# Patient Record
Sex: Female | Born: 1990 | Race: Black or African American | Hispanic: No | Marital: Single | Smoking: Current every day smoker
Health system: Southern US, Community
[De-identification: ages and names within clinical notes are randomized; demographics above are authoritative.]

---

## 2013-05-27 ENCOUNTER — Encounter (HOSPITAL_COMMUNITY): Payer: Self-pay | Admitting: Emergency Medicine

## 2013-05-27 ENCOUNTER — Emergency Department (HOSPITAL_COMMUNITY): Payer: Self-pay

## 2013-05-27 ENCOUNTER — Emergency Department (HOSPITAL_COMMUNITY)
Admission: EM | Admit: 2013-05-27 | Discharge: 2013-05-27 | Discharge status: Against Medical Advice | Payer: Self-pay | Attending: Emergency Medicine | Admitting: Emergency Medicine

## 2013-05-27 DIAGNOSIS — R102 Pelvic and perineal pain: Secondary | ICD-10-CM

## 2013-05-27 DIAGNOSIS — N949 Unspecified condition associated with female genital organs and menstrual cycle: Secondary | ICD-10-CM | POA: Insufficient documentation

## 2013-05-27 DIAGNOSIS — F17200 Nicotine dependence, unspecified, uncomplicated: Secondary | ICD-10-CM | POA: Insufficient documentation

## 2013-05-27 DIAGNOSIS — Z3202 Encounter for pregnancy test, result negative: Secondary | ICD-10-CM | POA: Insufficient documentation

## 2013-05-27 DIAGNOSIS — R109 Unspecified abdominal pain: Secondary | ICD-10-CM | POA: Insufficient documentation

## 2013-05-27 LAB — URINALYSIS, ROUTINE W REFLEX MICROSCOPIC
Bilirubin Urine: NEGATIVE
GLUCOSE, UA: NEGATIVE mg/dL
HGB URINE DIPSTICK: NEGATIVE
Ketones, ur: NEGATIVE mg/dL
Leukocytes, UA: NEGATIVE
Nitrite: NEGATIVE
Protein, ur: NEGATIVE mg/dL
SPECIFIC GRAVITY, URINE: 1.029 (ref 1.005–1.030)
Urobilinogen, UA: 1 mg/dL (ref 0.0–1.0)
pH: 5.5 (ref 5.0–8.0)

## 2013-05-27 LAB — HIV ANTIBODY (ROUTINE TESTING W REFLEX): HIV: NONREACTIVE

## 2013-05-27 LAB — CBC WITH DIFFERENTIAL/PLATELET
BASOS ABS: 0 10*3/uL (ref 0.0–0.1)
Basophils Relative: 0 % (ref 0–1)
EOS PCT: 3 % (ref 0–5)
Eosinophils Absolute: 0.2 10*3/uL (ref 0.0–0.7)
HCT: 43.2 % (ref 36.0–46.0)
Hemoglobin: 14.7 g/dL (ref 12.0–15.0)
LYMPHS PCT: 32 % (ref 12–46)
Lymphs Abs: 2.9 10*3/uL (ref 0.7–4.0)
MCH: 29.9 pg (ref 26.0–34.0)
MCHC: 34 g/dL (ref 30.0–36.0)
MCV: 87.8 fL (ref 78.0–100.0)
Monocytes Absolute: 0.8 10*3/uL (ref 0.1–1.0)
Monocytes Relative: 9 % (ref 3–12)
NEUTROS ABS: 5 10*3/uL (ref 1.7–7.7)
Neutrophils Relative %: 56 % (ref 43–77)
Platelets: 323 10*3/uL (ref 150–400)
RBC: 4.92 MIL/uL (ref 3.87–5.11)
RDW: 13 % (ref 11.5–15.5)
WBC: 9 10*3/uL (ref 4.0–10.5)

## 2013-05-27 LAB — WET PREP, GENITAL
Clue Cells Wet Prep HPF POC: NONE SEEN
Trich, Wet Prep: NONE SEEN
WBC, Wet Prep HPF POC: NONE SEEN
Yeast Wet Prep HPF POC: NONE SEEN

## 2013-05-27 LAB — I-STAT CREATININE, ED: CREATININE: 0.9 mg/dL (ref 0.50–1.10)

## 2013-05-27 LAB — PREGNANCY, URINE: PREG TEST UR: NEGATIVE

## 2013-05-27 MED ORDER — METRONIDAZOLE 500 MG PO TABS
500.0000 mg | ORAL_TABLET | Freq: Once | ORAL | Status: AC
  Administered 2013-05-27: 500 mg via ORAL
  Filled 2013-05-27: qty 1

## 2013-05-27 MED ORDER — CEFTRIAXONE SODIUM 250 MG IJ SOLR
250.0000 mg | Freq: Once | INTRAMUSCULAR | Status: AC
  Administered 2013-05-27: 250 mg via INTRAMUSCULAR
  Filled 2013-05-27: qty 250

## 2013-05-27 MED ORDER — HYDROMORPHONE HCL PF 1 MG/ML IJ SOLN
1.0000 mg | Freq: Once | INTRAMUSCULAR | Status: DC
  Filled 2013-05-27: qty 1

## 2013-05-27 MED ORDER — HYDROCODONE-ACETAMINOPHEN 5-325 MG PO TABS
2.0000 | ORAL_TABLET | Freq: Once | ORAL | Status: AC
  Administered 2013-05-27: 2 via ORAL
  Filled 2013-05-27: qty 2

## 2013-05-27 MED ORDER — METRONIDAZOLE 500 MG PO TABS
500.0000 mg | ORAL_TABLET | Freq: Two times a day (BID) | ORAL | Status: DC
Start: 2013-05-27 — End: 2013-10-23

## 2013-05-27 MED ORDER — OXYCODONE-ACETAMINOPHEN 5-325 MG PO TABS: 1.0000 | ORAL_TABLET | Freq: Four times a day (QID) | ORAL | Status: DC | PRN

## 2013-05-27 MED ORDER — DOXYCYCLINE HYCLATE 100 MG PO TABS
100.0000 mg | ORAL_TABLET | Freq: Once | ORAL | Status: AC
  Administered 2013-05-27: 100 mg via ORAL
  Filled 2013-05-27: qty 1

## 2013-05-27 MED ORDER — KETOROLAC TROMETHAMINE 30 MG/ML IJ SOLN
30.0000 mg | Freq: Once | INTRAMUSCULAR | Status: AC
  Administered 2013-05-27: 30 mg via INTRAMUSCULAR
  Filled 2013-05-27: qty 1

## 2013-05-27 MED ORDER — DOXYCYCLINE HYCLATE 100 MG PO CAPS: 100.0000 mg | ORAL_CAPSULE | Freq: Two times a day (BID) | ORAL | Status: DC

## 2013-05-27 MED ORDER — ONDANSETRON HCL 4 MG/2ML IJ SOLN
4.0000 mg | INTRAMUSCULAR | Status: DC
  Filled 2013-05-27: qty 2

## 2013-05-27 NOTE — ED Provider Notes (Signed)
I met with patient at 16:30.  Examined her abdomen, and reviewed her history.  Pain for over 48 hours.  Tenderness in right pelvis.  No direct RLQ/McBurney's TTP.  No peritoneal irritation.  Pain reproduced per patient with pelvic exam and with Pelvic US.  CT ordered, and labs requested.  Pt refuses CT scan after my re-evaluation.  Met with her again at 19:02.  Reviewed Morbidity associated with perforated/untreated appendicitis including chronic pain, need for additional procedures, SBOs, death, sepsis, prolonged hospitalization.  Ct tech standing in room ready to take patient directly to Ct.   Pt still refuses.  Will ask her to sign AMA.  Tanna Furry, MD 05/27/13 1911

## 2013-05-27 NOTE — ED Provider Notes (Signed)
Medical screening examination/treatment/procedure(s) were performed by non-physician practitioner and as supervising physician I was immediately available for consultation/collaboration.   EKG Interpretation None        Calil Amor, MD 05/27/13 2002 

## 2013-05-27 NOTE — ED Provider Notes (Signed)
CSN: [id number]     Arrival date & time 05/27/13  1327 History   First MD Initiated Contact with Patient 05/27/13 1401     Chief Complaint  Patient presents with  . Vaginal Pain  . Flank Pain     (Consider location/radiation/quality/duration/timing/severity/associated sxs/prior Treatment) HPI  Pt reports two days of sharp initially itnermittent then constant vaginal pain that radiates into suprapubic area and then into right flank.  Pain is 10/10 intensity, worse with coughing, sitting, certain positions.  Pt has Mirena in place that was placed in July 2014 in [place].  States she usually has 3 days of spotting at the beginning of every month but did not have spotting this month.  Denies fevers, N/V, change in bowel habits, urinary symptoms, abnormal vaginal discharge or bleeding.  No prior abdominal surgeries.    History reviewed. No pertinent past medical history. History reviewed. No pertinent past surgical history. No family history on file. History  Substance Use Topics  . Smoking status: Current Every Day Smoker -- 0.50 packs/day    Types: Cigarettes  . Smokeless tobacco: Not on file  . Alcohol Use: No   OB History   Grav Para Term Preterm Abortions TAB SAB Ect Mult Living                 Review of Systems  Constitutional: Negative for fever.  Respiratory: Negative for cough and shortness of breath.   Cardiovascular: Negative for chest pain.  Gastrointestinal: Negative for nausea, vomiting, abdominal pain and diarrhea.  Genitourinary: Negative for dysuria, urgency, frequency, vaginal bleeding and vaginal discharge.  All other systems reviewed and are negative.     Allergies  Review of patient's allergies indicates no known allergies.  Home Medications   Prior to Admission medications   Medication Sig Start Date End Date Taking? Authorizing Provider  levonorgestrel (MIRENA) 20 MCG/24HR IUD 1 each by Intrauterine route once.   Yes Historical Provider, MD   BP  124/91  Pulse 86  Temp(Src) 98.1 F (36.7 C) (Oral)  Resp 20  SpO2 98%  LMP 04/08/2013 Physical Exam  Nursing note and vitals reviewed. Constitutional: She appears well-developed and well-nourished. No distress.  HENT:  Head: Normocephalic and atraumatic.  Neck: Neck supple.  Pulmonary/Chest: Effort normal.  Abdominal: Soft. She exhibits no distension and no mass. There is no tenderness. There is no rebound and no guarding.  Genitourinary: Uterus is tender. Cervix exhibits motion tenderness. Cervix exhibits no discharge and no friability. Right adnexum displays no tenderness. Left adnexum displays no tenderness. There is tenderness around the vagina. No erythema or bleeding around the vagina. No foreign body around the vagina. No signs of injury around the vagina. No vaginal discharge found.  Exam limited secondary to patient body habitus. IUD string is not present coming through cervix.   Neurological: She is alert.  Skin: She is not diaphoretic.    ED Course  Procedures (including critical care time) Labs Review Labs Reviewed  URINALYSIS, ROUTINE W REFLEX MICROSCOPIC - Abnormal; Notable for the following:    APPearance CLOUDY (*)    All other components within normal limits  WET PREP, GENITAL  GC/CHLAMYDIA PROBE AMP  PREGNANCY, URINE  HIV ANTIBODY (ROUTINE TESTING)    Imaging Review No results found.   EKG Interpretation None      3:53 PM Discussed pt with Antony MaduraKelly Humes, PA-C, who assumes care of patient at change of shift.    MDM   Final diagnoses:  Pelvic pain  Pt with vaginal pain x 2 days, IUD not apparent on pelvic exam.  Pt pending pelvic US at change of shift.  Will need gyn referral.      Trixie Dredgemily Angus Amini, PA-C 05/27/13 1555

## 2013-05-27 NOTE — ED Provider Notes (Signed)
1500 - [facility] care [facility] at shift change. Patient presenting for vaginal pain x 2 days, IUD not apparent on pelvic exam. Pain is mostly R sided. U/S imaging pending. Plan to disposition appropriately based on imaging findings.  1740 - U/S shows abnormal appearing L ovary findings most c/w hemorrhagic cystic lesion; imaging findings unlikely to be ovarian abscess per radiology reading. F/u ultrasound recommended. IUD appears to be in proper position. No explanation for patient's R sided pain. Have evaluated patient who does have TTP in RLQ. Though atypical that appendicitis would cause CMT and pain originating in her "vagina" radiating to her RLQ, unable to fully exclude this as a diagnosis. Have consulted with my attending Dr. Fayrene FearingJames who will evaluate patient to decide how to proceed. At very least, believe patient warrants tx for PID given symptoms and abdominal tenderness.  1900 - CT pending. Ceftriaxone and first dose of doxy and flagyl ordered. Patient insistent on leaving. CT arrived to take patient for imaging which she declined. Patient stating that she "has kids at home" and cannot stay. It has been explained to the patient that like a CT scan means that appendicitis is unable to be excluded. Patient's been told that this may lead to worsening health outcomes to which she verbalizes understanding. Have initiated treatment for PID in the emergency department. Patient will be discharged AGAINST MEDICAL ADVICE with prescriptions for doxycycline and Flagyl as well as Percocet for pain control. Return precautions provided.   Results for orders placed during the hospital encounter of 05/27/13  WET PREP, GENITAL      Result Value Ref Range   Yeast Wet Prep HPF POC NONE SEEN  NONE SEEN   Trich, Wet Prep NONE SEEN  NONE SEEN   Clue Cells Wet Prep HPF POC NONE SEEN  NONE SEEN   WBC, Wet Prep HPF POC NONE SEEN  NONE SEEN  URINALYSIS, ROUTINE W REFLEX MICROSCOPIC      Result Value  Ref Range   Color, Urine YELLOW  YELLOW   APPearance CLOUDY (*) CLEAR   Specific Gravity, Urine 1.029  1.005 - 1.030   pH 5.5  5.0 - 8.0   Glucose, UA NEGATIVE  NEGATIVE mg/dL   Hgb urine dipstick NEGATIVE  NEGATIVE   Bilirubin Urine NEGATIVE  NEGATIVE   Ketones, ur NEGATIVE  NEGATIVE mg/dL   Protein, ur NEGATIVE  NEGATIVE mg/dL   Urobilinogen, UA 1.0  0.0 - 1.0 mg/dL   Nitrite NEGATIVE  NEGATIVE   Leukocytes, UA NEGATIVE  NEGATIVE  PREGNANCY, URINE      Result Value Ref Range   Preg Test, Ur NEGATIVE  NEGATIVE  HIV ANTIBODY (ROUTINE TESTING)      Result Value Ref Range   HIV 1&2 Ab, 4th Generation NONREACTIVE  NONREACTIVE  CBC WITH DIFFERENTIAL      Result Value Ref Range   WBC 9.0  4.0 - 10.5 K/uL   RBC 4.92  3.87 - 5.11 MIL/uL   Hemoglobin 14.7  12.0 - 15.0 g/dL   HCT 16.143.2  09.636.0 - 04.546.0 %   MCV 87.8  78.0 - 100.0 fL   MCH 29.9  26.0 - 34.0 pg   MCHC 34.0  30.0 - 36.0 g/dL   RDW 40.913.0  81.111.5 - 91.415.5 %   Platelets 323  150 - 400 K/uL   Neutrophils Relative % 56  43 - 77 %   Neutro Abs 5.0  1.7 - 7.7 K/uL   Lymphocytes Relative 32  12 - 46 %   Lymphs Abs 2.9  0.7 - 4.0 K/uL   Monocytes Relative 9  3 - 12 %   Monocytes Absolute 0.8  0.1 - 1.0 K/uL   Eosinophils Relative 3  0 - 5 %   Eosinophils Absolute 0.2  0.0 - 0.7 K/uL   Basophils Relative 0  0 - 1 %   Basophils Absolute 0.0  0.0 - 0.1 K/uL  I-STAT CREATININE, ED      Result Value Ref Range   Creatinine, Ser 0.90  0.50 - 1.10 mg/dL   [place] Transvaginal Non-ob  05/27/2013   CLINICAL DATA:  Flank pain and vaginal pain.  EXAM: TRANSABDOMINAL AND TRANSVAGINAL ULTRASOUND OF PELVIS  DOPPLER ULTRASOUND OF OVARIES  TECHNIQUE: Both transabdominal and transvaginal ultrasound examinations of the pelvis were performed. Transabdominal technique was performed for global imaging of the pelvis including uterus, ovaries, adnexal regions, and pelvic cul-de-sac.  It was necessary to proceed with endovaginal exam following the transabdominal  exam to visualize the uterus and ovaries. Color and duplex Doppler ultrasound was utilized to evaluate blood flow to the ovaries.  COMPARISON:  None.  FINDINGS: Uterus  Measurements: 7.2 x 3.3 x 4.8 cm. No fibroids or other mass visualized.  Endometrium  Thickness: 4 mm. Shadowing caused by IUD appears appropriately position.  Right ovary  Measurements: 3.1 x 1.8 x 2.6 cm. Normal appearance/no adnexal mass.  Left ovary  Measurements: 5.5 x 2.4 x 2.8 cm, including a 2.2 x 1.8 x 1.7 cm complex cystic lesion with internal reticular septations and some blood flow within its margin as on image 43 of the first series. Adjacent 3.1 x 2.0 cm mass like portion of this lesion is echogenic but without demonstrated significant internal blood flow. Normal appearance/no adnexal mass.  Pulsed Doppler evaluation of both ovaries demonstrates normal low-resistance arterial and venous waveforms.  Other findings  Small amount of free pelvic fluid, appears complex.  IMPRESSION: 1. Abnormal appearance, left ovary, with a hemorrhagic cystic lesion and adjacent echogenic structure in the left ovary, without definite internal blood flow the lesion but with expected parenchymal flow in the left ovary. Hemorrhagic ovarian cyst is favored. There is a small amount of complex free pelvic fluid, likely representing blood products. Today's urine pregnancy test was negative and so ectopic pregnancy is not suspected. Ovarian abscess is unlikely. Assuming that the patient is stable and management is conservative, I do recommend a followup pelvic ultrasound in 6 weeks time in order to ensure resolution of this lesion and exclude the unlikely possibility of neoplasm. 2. No findings of ovarian torsion.   Electronically Signed   By: Herbie BaltimoreWalt  Liebkemann M.D.   On: 05/27/2013 17:25   [place] Pelvis Complete  05/27/2013   CLINICAL DATA:  Flank pain and vaginal pain.  EXAM: TRANSABDOMINAL AND TRANSVAGINAL ULTRASOUND OF PELVIS  DOPPLER ULTRASOUND OF OVARIES   TECHNIQUE: Both transabdominal and transvaginal ultrasound examinations of the pelvis were performed. Transabdominal technique was performed for global imaging of the pelvis including uterus, ovaries, adnexal regions, and pelvic cul-de-sac.  It was necessary to proceed with endovaginal exam following the transabdominal exam to visualize the uterus and ovaries. Color and duplex Doppler ultrasound was utilized to evaluate blood flow to the ovaries.  COMPARISON:  None.  FINDINGS: Uterus  Measurements: 7.2 x 3.3 x 4.8 cm. No fibroids or other mass visualized.  Endometrium  Thickness: 4 mm. Shadowing caused by IUD appears appropriately position.  Right ovary  Measurements: 3.1 x 1.8 x  2.6 cm. Normal appearance/no adnexal mass.  Left ovary  Measurements: 5.5 x 2.4 x 2.8 cm, including a 2.2 x 1.8 x 1.7 cm complex cystic lesion with internal reticular septations and some blood flow within its margin as on image 43 of the first series. Adjacent 3.1 x 2.0 cm mass like portion of this lesion is echogenic but without demonstrated significant internal blood flow. Normal appearance/no adnexal mass.  Pulsed Doppler evaluation of both ovaries demonstrates normal low-resistance arterial and venous waveforms.  Other findings  Small amount of free pelvic fluid, appears complex.  IMPRESSION: 1. Abnormal appearance, left ovary, with a hemorrhagic cystic lesion and adjacent echogenic structure in the left ovary, without definite internal blood flow the lesion but with expected parenchymal flow in the left ovary. Hemorrhagic ovarian cyst is favored. There is a small amount of complex free pelvic fluid, likely representing blood products. Today's urine pregnancy test was negative and so ectopic pregnancy is not suspected. Ovarian abscess is unlikely. Assuming that the patient is stable and management is conservative, I do recommend a followup pelvic ultrasound in 6 weeks time in order to ensure resolution of this lesion and exclude the  unlikely possibility of neoplasm. 2. No findings of ovarian torsion.   Electronically Signed   By: Herbie Baltimore M.D.   On: 05/27/2013 17:25   [place] Art/ven Flow Abd Pelv Doppler  05/27/2013   CLINICAL DATA:  Flank pain and vaginal pain.  EXAM: TRANSABDOMINAL AND TRANSVAGINAL ULTRASOUND OF PELVIS  DOPPLER ULTRASOUND OF OVARIES  TECHNIQUE: Both transabdominal and transvaginal ultrasound examinations of the pelvis were performed. Transabdominal technique was performed for global imaging of the pelvis including uterus, ovaries, adnexal regions, and pelvic cul-de-sac.  It was necessary to proceed with endovaginal exam following the transabdominal exam to visualize the uterus and ovaries. Color and duplex Doppler ultrasound was utilized to evaluate blood flow to the ovaries.  COMPARISON:  None.  FINDINGS: Uterus  Measurements: 7.2 x 3.3 x 4.8 cm. No fibroids or other mass visualized.  Endometrium  Thickness: 4 mm. Shadowing caused by IUD appears appropriately position.  Right ovary  Measurements: 3.1 x 1.8 x 2.6 cm. Normal appearance/no adnexal mass.  Left ovary  Measurements: 5.5 x 2.4 x 2.8 cm, including a 2.2 x 1.8 x 1.7 cm complex cystic lesion with internal reticular septations and some blood flow within its margin as on image 43 of the first series. Adjacent 3.1 x 2.0 cm mass like portion of this lesion is echogenic but without demonstrated significant internal blood flow. Normal appearance/no adnexal mass.  Pulsed Doppler evaluation of both ovaries demonstrates normal low-resistance arterial and venous waveforms.  Other findings  Small amount of free pelvic fluid, appears complex.  IMPRESSION: 1. Abnormal appearance, left ovary, with a hemorrhagic cystic lesion and adjacent echogenic structure in the left ovary, without definite internal blood flow the lesion but with expected parenchymal flow in the left ovary. Hemorrhagic ovarian cyst is favored. There is a small amount of complex free pelvic fluid, likely  representing blood products. Today's urine pregnancy test was negative and so ectopic pregnancy is not suspected. Ovarian abscess is unlikely. Assuming that the patient is stable and management is conservative, I do recommend a followup pelvic ultrasound in 6 weeks time in order to ensure resolution of this lesion and exclude the unlikely possibility of neoplasm. 2. No findings of ovarian torsion.   Electronically Signed   By: Herbie Baltimore M.D.   On: 05/27/2013 17:25  Antony Madura, PA-C 05/27/13 1920

## 2013-05-27 NOTE — ED Notes (Signed)
Pt from home c/o vaginal pain that radiates to R flank. Pt denies dysuria, frequency, vaginal d/c, N/V/D/F. Pt states that most of the pain is in her vagina, and wonders if "my Maxie Bettermerina has been knocked out of place." Pt adds that she is having back pain when she breathes, but has no cough. Pt is A&O and in NAD

## 2013-05-27 NOTE — Discharge Instructions (Signed)
We have initiated treatment for pelvic inflammatory disease in the emergency department. Began taking doxycycline and metronidazole at 7 AM tomorrow. Recommend ibuprofen 600 mg every 6 hours as needed for pain control. He may take Percocet as needed for breakthrough pain. Do not drink alcohol while taking metronidazole as it will make you violently ill vomiting. An OB/GYN for followup. Return if symptoms worsen.  Pelvic Inflammatory Disease Pelvic inflammatory disease (PID) refers to an infection in some or all of the female organs. The infection can be in the uterus, ovaries, fallopian tubes, or the surrounding tissues in the pelvis. PID can cause abdominal or pelvic pain that comes on suddenly (acute pelvic pain). PID is a serious infection because it can lead to lasting (chronic) pelvic pain or the inability to have children (infertile).  CAUSES  The infection is often caused by the normal bacteria found in the vaginal tissues. PID may also be caused by an infection that is spread during sexual contact. PID can also occur following:   The birth of a baby.   A miscarriage.   An abortion.   Major pelvic surgery.   The use of an intrauterine device (IUD).   A sexual assault.  RISK FACTORS Certain factors can put a person at higher risk for PID, such as:  Being younger than 25 years.  Being sexually active at [place] age.  Usingnonbarrier contraception.  Havingmultiple sexual partners.  Having sex with someone who has symptoms of a genital infection.  Using oral contraception. Other times, certain behaviors can increase the possibility of getting PID, such as:  Having sex during your period.  Using a vaginal douche.  Having an intrauterine device (IUD) in place. SYMPTOMS   Abdominal or pelvic pain.   Fever.   Chills.   Abnormal vaginal discharge.  Abnormal uterine bleeding.   Unusual pain shortly after finishing your period. DIAGNOSIS  Your caregiver  will choose some of the following methods to make a diagnosis, such as:   Performinga physical exam and history. A pelvic exam typically reveals a very tender uterus and surrounding pelvis.   Ordering laboratory tests including a pregnancy test, blood tests, and urine test.  Orderingcultures of the vagina and cervix to check for a sexually transmitted infection (STI).  Performing an ultrasound.   Performing a laparoscopic procedure to look inside the pelvis.  TREATMENT   Antibiotic medicines may be prescribed and taken by mouth.   Sexual partners may be treated when the infection is caused by a sexually transmitted disease (STD).   Hospitalization may be needed to give antibiotics intravenously.  Surgery may be needed, but this is rare. It may take weeks until you are completely well. If you are diagnosed with PID, you should also be checked for human immunodeficiency virus (HIV). HOME CARE INSTRUCTIONS   If given, take your antibiotics as directed. Finish the medicine even if you start to feel better.   Only take over-the-counter or prescription medicines for pain, discomfort, or fever as directed by your caregiver.   Do not have sexual intercourse until treatment is completed or as directed by your caregiver. If PID is confirmed, your recent sexual partner(s) will need treatment.   Keep your follow-up appointments. SEEK MEDICAL CARE IF:   You have increased or abnormal vaginal discharge.   You need prescription medicine for your pain.   You vomit.   You cannot take your medicines.   Your partner has an STD.  SEEK IMMEDIATE MEDICAL CARE IF:  You have a fever.   You have increased abdominal or pelvic pain.   You have chills.   You have pain when you urinate.   You are not better after 72 hours following treatment.  MAKE SURE YOU:   Understand these instructions.  Will watch your condition.  Will get help right away if you are not doing  well or get worse. Document Released: 01/25/2005 Document Revised: 05/22/2012 Document Reviewed: 01/21/2011 [facility] Patient Information 2014 [employer] [place].

## 2013-05-27 NOTE — ED Provider Notes (Signed)
Medical screening examination/treatment/procedure(s) were performed by non-physician practitioner and as supervising physician I was immediately available for consultation/collaboration.   EKG Interpretation None       Doug SouSam Female Iafrate, MD 05/27/13 1600

## 2013-05-27 NOTE — ED Notes (Signed)
Pt refusing to stay to get CT even though CT is in room to take pt for scan. Pt sates that she would be here another hour waiting on the results, Dr. Fayrene FearingJames in room explaining to her that she can leave but she is leaving AMA.

## 2013-05-27 NOTE — ED Notes (Signed)
PA at bedside.

## 2013-05-28 LAB — GC/CHLAMYDIA PROBE AMP
CT Probe RNA: NEGATIVE
GC Probe RNA: NEGATIVE

## 2013-10-23 ENCOUNTER — Encounter (HOSPITAL_COMMUNITY): Payer: Self-pay | Admitting: Emergency Medicine

## 2013-10-23 ENCOUNTER — Emergency Department (HOSPITAL_COMMUNITY)
Admission: EM | Admit: 2013-10-23 | Discharge: 2013-10-23 | Disposition: A | Discharge status: Home / Self Care | Payer: Self-pay | Attending: Emergency Medicine | Admitting: Emergency Medicine

## 2013-10-23 DIAGNOSIS — F17200 Nicotine dependence, unspecified, uncomplicated: Secondary | ICD-10-CM | POA: Insufficient documentation

## 2013-10-23 DIAGNOSIS — K0889 Other specified disorders of teeth and supporting structures: Secondary | ICD-10-CM

## 2013-10-23 DIAGNOSIS — K089 Disorder of teeth and supporting structures, unspecified: Secondary | ICD-10-CM | POA: Insufficient documentation

## 2013-10-23 DIAGNOSIS — K029 Dental caries, unspecified: Secondary | ICD-10-CM | POA: Insufficient documentation

## 2013-10-23 MED ORDER — OXYCODONE-ACETAMINOPHEN 5-325 MG PO TABS
1.0000 | ORAL_TABLET | Freq: Once | ORAL | Status: AC
  Administered 2013-10-23: 1 via ORAL
  Filled 2013-10-23: qty 1

## 2013-10-23 MED ORDER — HYDROCODONE-ACETAMINOPHEN 5-325 MG PO TABS: ORAL_TABLET | ORAL | Status: DC

## 2013-10-23 MED ORDER — NAPROXEN 500 MG PO TABS: 500.0000 mg | ORAL_TABLET | Freq: Two times a day (BID) | ORAL | Status: DC

## 2013-10-23 MED ORDER — PENICILLIN V POTASSIUM 500 MG PO TABS: 500.0000 mg | ORAL_TABLET | Freq: Three times a day (TID) | ORAL | Status: DC

## 2013-10-23 NOTE — ED Provider Notes (Signed)
CSN: [id number]     Arrival date & time 10/23/13  1856 History  This chart was scribed for non-physician practitioner, Rhea Bleacher, PA-C working with Glynn Octave, MD by Greggory Stallion, ED scribe. This patient was seen in room TR05C/TR05C and the patient's care was started at 7:53 PM.   Chief Complaint  Patient presents with  . Dental Pain   The history is provided by the patient. No language interpreter was used.   HPI Comments: Amber Bray is a 23 y.o. female who presents to the Emergency Department complaining of worsening sharp left upper dental pain that started 4 days ago. Reports associated facial swelling. Denies any drainage from the tooth. Pt has taken Advil PM and tried ice with some relief of swelling. Pressure worsens the pain.   History reviewed. No pertinent past medical history. History reviewed. No pertinent past surgical history. No family history on file. History  Substance Use Topics  . Smoking status: Current Every Day Smoker -- 0.50 packs/day    Types: Cigarettes  . Smokeless tobacco: Not on file  . Alcohol Use: No   OB History   Grav Para Term Preterm Abortions TAB SAB Ect Mult Living                 Review of Systems  Constitutional: Negative for fever.  HENT: Positive for dental problem and facial swelling.   Eyes: Negative for redness.  Respiratory: Negative for shortness of breath.   Cardiovascular: Negative for chest pain.  Gastrointestinal: Negative for abdominal distention.  Musculoskeletal: Negative for gait problem.  Skin: Negative for rash.  Neurological: Negative for speech difficulty.  Psychiatric/Behavioral: Negative for confusion.   Allergies  Review of patient's allergies indicates no known allergies.  Home Medications   Prior to Admission medications   Medication Sig Start Date End Date Taking? Authorizing Provider  levonorgestrel (MIRENA) 20 MCG/24HR IUD 1 each by Intrauterine route once.    Historical Provider, MD   BP  128/101  Pulse 88  Temp(Src) 98.3 F (36.8 C) (Oral)  Resp 20  SpO2 99%  LMP 10/22/2013  Physical Exam  Nursing note and vitals reviewed. Constitutional: She appears well-developed and well-nourished.  HENT:  Head: Normocephalic and atraumatic.  Right Ear: Tympanic membrane, external ear and ear canal normal.  Left Ear: Tympanic membrane, external ear and ear canal normal.  Nose: Nose normal.  Mouth/Throat: Uvula is midline, oropharynx is clear and moist and mucous membranes are normal. No trismus in the jaw. Abnormal dentition. Dental caries present. No dental abscesses or uvula swelling. No tonsillar abscesses.  Patient with no significant tenderness on exam. States pain is at base of L maxillary 2nd molar. No swelling or erythema noted on exam.  Eyes: Conjunctivae are normal.  Neck: Normal range of motion. Neck supple.  No neck swelling or Ludwig's angina  Lymphadenopathy:    She has no cervical adenopathy.  Neurological: She is alert.  Skin: Skin is warm and dry.  Psychiatric: She has a normal mood and affect.    ED Course  Procedures (including critical care time)  DIAGNOSTIC STUDIES: Oxygen Saturation is 99% on RA, normal by my interpretation.    COORDINATION OF CARE: 7:55 PM-Discussed treatment plan which includes narcotic pain medication and continuing ibuprofen with pt at bedside and pt agreed to plan. Pt will be discharged with penicillin prescription but advised her not to fill it unless the facial swelling begins again and symptoms do not start resolving. Will give pt dental referrals and  advised her to follow up.   Labs Review Labs Reviewed - No data to display  Imaging Review No results found.   EKG Interpretation None      Patient seen and examined.    Vital signs reviewed and are as follows: BP 124/81  Pulse 59  Temp(Src) 98.3 F (36.8 C) (Oral)  Resp 20  SpO2 100%  LMP 10/22/2013  Patient counseled on use of narcotic pain medications.  Counseled not to combine these medications with others containing tylenol. Urged not to drink alcohol, drive, or perform any other activities that requires focus while taking these medications. The patient verbalizes understanding and agrees with the plan.  Patient counseled to take prescribed medications as directed, return with worsening facial or neck swelling, and to follow-up with their dentist as soon as possible.    MDM   Final diagnoses:  Pain, dental   Patient with toothache. No fever. Exam unconcerning for Ludwig's angina or other deep tissue infection in neck.   As there is no facial swelling or gum findings, will not prescribe antibiotics at this time. But patient will be issued rx to take if no improvement in 72 hrs or if she develops facial swelling. Will treat with pain medication.     I personally performed the services described in this documentation, which was scribed in my presence. The recorded information has been reviewed and is accurate.  Renne Crigler, PA-C 10/23/13 2051

## 2013-10-23 NOTE — ED Notes (Signed)
C/o L upper tooth pain, onset friday night. Also swelling. (denies: drainage, nv, fever or there sx), has tried ice and ibuprofen. No advil today.

## 2013-10-23 NOTE — Discharge Instructions (Signed)
Please read and follow all provided instructions.  Your diagnoses today include:  1. Pain, dental    The exam and treatment you received today has been provided on an emergency basis only. This is not a substitute for complete medical or dental care.  Tests performed today include:  Vital signs. See below for your results today.   Medications prescribed:   Vicodin (hydrocodone/acetaminophen) - narcotic pain medication  DO NOT drive or perform any activities that require you to be awake and alert because this medicine can make you drowsy. BE VERY CAREFUL not to take multiple medicines containing Tylenol (also called acetaminophen). Doing so can lead to an overdose which can damage your liver and cause liver failure and possibly death.   Naproxen - anti-inflammatory pain medication  Do not exceed  naproxen every 12 hours, take with food  You have been prescribed an anti-inflammatory medication or NSAID. Take with food. Take smallest effective dose for the shortest duration needed for your pain. Stop taking if you experience stomach pain or vomiting.    Penicillin - antibiotic  You have been prescribed an antibiotic medicine: take the entire course of medicine even if you are feeling better. Stopping early can cause the antibiotic not to work.  Only fill this antibiotic if you worsen or continue to have symptoms in 72 hours. If filled, take entire course of antibiotics as directed and follow-up with your doctor.  Take any prescribed medications only as directed.  Home care instructions:  Follow any educational materials contained in this packet.  Follow-up instructions: Please follow-up with your dentist for further evaluation of your symptoms.   Dental Assistance: See below for dental referrals  Return instructions:   Please return to the Emergency Department if you experience worsening symptoms.  Please return if you develop a fever, you develop more swelling in your  face or neck, you have trouble breathing or swallowing food.  Please return if you have any other emergent concerns.  Additional Information:  Your vital signs today were: BP 128/101   Pulse 88   Temp(Src) 98.3 F (36.8 C) (Oral)   Resp 20   SpO2 99%   LMP 10/22/2013 If your blood pressure (BP) was elevated above 135/85 this visit, please have this repeated by your doctor within one month. -------------- Dental Care: Organization         Address  Phone  Notes  [facility] Department of [facility] [address] [place], [place] [phone] Accepts children up to age 51 who are enrolled in [place] or [place] Health Choice; pregnant women with a Medicaid card; and children who have applied for Medicaid or [place] Health Choice, but were declined, whose parents can pay a reduced fee at time of service.  [facility] Department of [facility]  [address], [place] [phone] Accepts children up to age 85 who are enrolled in [place] or [place] Health Choice; pregnant women with a Medicaid card; and children who have applied for Medicaid or [place] Health Choice, but were declined, whose parents can pay a reduced fee at time of service.  [facility]  [address] [place], [place] [phone] Patients are seen by appointment only. Walk-ins are not accepted. [facility] will see patients 35 years of age and older. Monday - Tuesday (8am-5pm) Most Wednesdays (8:30-5pm) $30 per visit, cash only  [facility] [employer] [facility]  [address], [place] [phone])  [phone] Patients are seen by appointment only. Walk-ins are not accepted. [facility] will see patients 65 years of age and older. One Wednesday Evening (Monthly: Volunteer Based).  $30 per visit, cash only  [employer] [facility] [employer]  [phone] for adults; Children under age 75, call Graduate Pediatric Dentistry at [phone]. Children  aged 50-14, please call [phone] to request a pediatric application.  Dental services are provided in all areas of dental care including fillings, crowns and bridges, complete and partial dentures, implants, gum treatment, root canals, and extractions. Preventive care is also provided. Treatment is provided to both adults and children. Patients are selected via a lottery and there is often a waiting list.   [facility] [address], [place]  [phone] [email or web address]   [facility] [address] [place], [place] [phone], Ext. [phone] Second and Fourth Thursday of each month, opens at 6:30 AM; Clinic ends at 9 AM.  Patients are seen on a first-come first-served basis, and a limited number are seen during each clinic.   [facility]  [address] [place], [place] [phone]   Eligibility Requirements You must have lived in [place], [place], or [place] counties for at least the last three months.   You cannot be eligible for state or federal sponsored [place], including [employer], [place], or [employer].   You generally cannot be eligible for healthcare insurance through your employer.    How to apply: Eligibility screenings are held every Tuesday and Wednesday afternoon from 1:00 pm until 4:00 pm. You do not need an appointment for the interview!  [facility] [address] [place], [place] [phone]   [facility]  [phone]   [facility]  [phone]   [facility]  [phone]

## 2013-10-23 NOTE — ED Provider Notes (Signed)
Medical screening examination/treatment/procedure(s) were performed by non-physician practitioner and as supervising physician I was immediately available for consultation/collaboration.   EKG Interpretation None        Demeisha Geraghty, MD 10/23/13 2322 

## 2014-02-17 ENCOUNTER — Encounter (HOSPITAL_COMMUNITY): Payer: Self-pay | Admitting: Emergency Medicine

## 2014-02-17 ENCOUNTER — Emergency Department (HOSPITAL_COMMUNITY)
Admission: EM | Admit: 2014-02-17 | Discharge: 2014-02-17 | Disposition: A | Discharge status: Home / Self Care | Payer: Self-pay | Attending: Emergency Medicine | Admitting: Emergency Medicine

## 2014-02-17 DIAGNOSIS — F419 Anxiety disorder, unspecified: Secondary | ICD-10-CM | POA: Insufficient documentation

## 2014-02-17 DIAGNOSIS — Z791 Long term (current) use of non-steroidal anti-inflammatories (NSAID): Secondary | ICD-10-CM | POA: Insufficient documentation

## 2014-02-17 DIAGNOSIS — Z720 Tobacco use: Secondary | ICD-10-CM | POA: Insufficient documentation

## 2014-02-17 DIAGNOSIS — Z792 Long term (current) use of antibiotics: Secondary | ICD-10-CM | POA: Insufficient documentation

## 2014-02-17 LAB — CBC
HCT: 43.7 % (ref 36.0–46.0)
HEMOGLOBIN: 14.5 g/dL (ref 12.0–15.0)
MCH: 29.3 pg (ref 26.0–34.0)
MCHC: 33.2 g/dL (ref 30.0–36.0)
MCV: 88.3 fL (ref 78.0–100.0)
Platelets: 328 10*3/uL (ref 150–400)
RBC: 4.95 MIL/uL (ref 3.87–5.11)
RDW: 13.3 % (ref 11.5–15.5)
WBC: 6.7 10*3/uL (ref 4.0–10.5)

## 2014-02-17 LAB — COMPREHENSIVE METABOLIC PANEL
ALT: 15 U/L (ref 0–35)
AST: 18 U/L (ref 0–37)
Albumin: 3.9 g/dL (ref 3.5–5.2)
Alkaline Phosphatase: 77 U/L (ref 39–117)
Anion gap: 8 (ref 5–15)
BUN: 10 mg/dL (ref 6–23)
CALCIUM: 9.4 mg/dL (ref 8.4–10.5)
CO2: 25 mmol/L (ref 19–32)
Chloride: 105 mEq/L (ref 96–112)
Creatinine, Ser: 0.92 mg/dL (ref 0.50–1.10)
GFR, EST NON AFRICAN AMERICAN: 86 mL/min — AB (ref >90)
GLUCOSE: 91 mg/dL (ref 70–99)
Potassium: 4.4 mmol/L (ref 3.5–5.1)
SODIUM: 138 mmol/L (ref 135–145)
Total Bilirubin: 0.2 mg/dL — ABNORMAL LOW (ref 0.3–1.2)
Total Protein: 7.4 g/dL (ref 6.0–8.3)

## 2014-02-17 MED ORDER — LORAZEPAM 1 MG PO TABS
1.0000 mg | ORAL_TABLET | Freq: Once | ORAL | Status: AC
  Administered 2014-02-17: 1 mg via ORAL
  Filled 2014-02-17: qty 1

## 2014-02-17 NOTE — Discharge Instructions (Signed)
It was our pleasure to provide your ER care today - we hope that you feel better.  Follow up with primary care doctor in coming week.  Return to ER if worse, new symptoms, other concern.  You were given medication in the ER that can cause drowsiness - no driving for the next 4 hours.     Generalized Anxiety Disorder Generalized anxiety disorder (GAD) is a mental disorder. It interferes with life functions, including relationships, work, and school. GAD is different from normal anxiety, which everyone experiences at some point in their lives in response to specific life events and activities. Normal anxiety actually helps us prepare for and get through these life events and activities. Normal anxiety goes away after the event or activity is over.  GAD causes anxiety that is not necessarily related to specific events or activities. It also causes excess anxiety in proportion to specific events or activities. The anxiety associated with GAD is also difficult to control. GAD can vary from mild to severe. People with severe GAD can have intense waves of anxiety with physical symptoms (panic attacks).  SYMPTOMS The anxiety and worry associated with GAD are difficult to control. This anxiety and worry are related to many life events and activities and also occur more days than not for 6 months or longer. People with GAD also have three or more of the following symptoms (one or more in children):  Restlessness.   Fatigue.  Difficulty concentrating.   Irritability.  Muscle tension.  Difficulty sleeping or unsatisfying sleep. DIAGNOSIS GAD is diagnosed through an assessment by your health care provider. Your health care provider will ask you questions aboutyour mood,physical symptoms, and events in your life. Your health care provider may ask you about your medical history and use of alcohol or drugs, including prescription medicines. Your health care provider may also do a physical exam and  blood tests. Certain medical conditions and the use of certain substances can cause symptoms similar to those associated with GAD. Your health care provider may refer you to a mental health specialist for further evaluation. TREATMENT The following therapies are usually used to treat GAD:   Medication. Antidepressant medication usually is prescribed for long-term daily control. Antianxiety medicines may be added in severe cases, especially when panic attacks occur.   Talk therapy (psychotherapy). Certain types of talk therapy can be helpful in treating GAD by providing support, education, and guidance. A form of talk therapy called cognitive behavioral therapy can teach you healthy ways to think about and react to daily life events and activities.  Stress managementtechniques. These include yoga, meditation, and exercise and can be very helpful when they are practiced regularly. A mental health specialist can help determine which treatment is best for you. Some people see improvement with one therapy. However, other people require a combination of therapies. Document Released: 05/22/2012 Document Revised: 06/11/2013 Document Reviewed: 05/22/2012 [facility] Patient Information 2015 [place]. This information is not intended to replace advice given to you by your health care provider. Make sure you discuss any questions you have with your health care provider.     Hyperventilation Hyperventilation is breathing that is deeper and more rapid than normal. It is usually associated with panic and anxiety. Hyperventilation can make you feel breathless. It is sometimes called overbreathing. Breathing out too much causes a decrease in the amount of carbon dioxide gas in the blood. This leads to tingling and numbness in the hands, feet, and around the mouth. If this continues,  your fingers, hands, and toes may begin to spasm. Hyperventilation usually lasts 20-30 minutes and can be associated with other  symptoms of panic and anxiety, including:   Chest pains or tightness.  A pounding or irregular, racing heartbeat (palpitations).  Dizziness.  Lightheadedness.  Dry mouth.  Weakness.  Confusion.  Sleep disturbance. CAUSES  Sudden onset (acute) hyperventilation is usually triggered by acute stress, anxiety, or emotional upset. Long-term (chronic) and recurring hyperventilation can occur with chronic lung problems, such emphysema or asthma. Other causes include:   Nervousness.  Stress.  Stimulant, drug, or alcohol use.  Lung disease.  Infections, such as pneumonia.  Heart problems.  Severe pain.  Waking from a bad dream.  Pregnancy.  Bleeding. HOME CARE INSTRUCTIONS  Learn and use breathing exercises that help you breathe from your diaphragm and abdomen.  Practice relaxation techniques to reduce stress, such as visualization, meditation, and muscle release.  During an attack, try breathing into a paper bag. This changes the carbon dioxide level and slows down breathing. SEEK IMMEDIATE MEDICAL CARE IF:  Your hyperventilation continues or gets worse. MAKE SURE YOU:  Understand these instructions.  Will watch your condition.  Will get help right away if you are not doing well or get worse. Document Released: 01/23/2000 Document Revised: 07/27/2011 Document Reviewed: 05/06/2011 [facility] Patient Information 2015 [place], [place]. This information is not intended to replace advice given to you by your health care provider. Make sure you discuss any questions you have with your health care provider.

## 2014-02-17 NOTE — ED Notes (Signed)
Pt. Stated, Ive had mouth numb, my teeth feel numb.  Im also having the shakes and having chills and then it gores away.  I take medicine cause [facility]  It might be the medications I take.

## 2014-02-17 NOTE — ED Provider Notes (Signed)
CSN: [id number]     Arrival date & time 02/17/14  1046 History   First MD Initiated Contact with Patient 02/17/14 1250     Chief Complaint  Patient presents with  . Shaking  . Chills    "mouth and teeth feel numb"     (Consider location/radiation/quality/duration/timing/severity/associated sxs/prior Treatment) The history is provided by the patient.  pt indicates hx anxiety, states was recently treated at [facility] for same. States last week her celexa was changed to paxil, but that she hasnt yet filled the paxil rx. States she is feeling anxious. States earlier today had 'hot flash', and then felt numb around mouth and teeth, and felt as if she was breathing fast. No palpitations. Denies cp or sob. No cough or uri c/o. No fever or chills. Has been eating and drinking normally, normal appetite. Denies depression.  Pt states otherwise her recent health has been c/w baseline.      History reviewed. No pertinent past medical history. History reviewed. No pertinent past surgical history. No family history on file. History  Substance Use Topics  . Smoking status: Current Every Day Smoker -- 0.50 packs/day    Types: Cigarettes  . Smokeless tobacco: Not on file  . Alcohol Use: No   OB History    No data available     Review of Systems  Constitutional: Negative for fever and chills.  HENT: Negative for sore throat.   Eyes: Negative for redness and visual disturbance.  Respiratory: Negative for shortness of breath.   Cardiovascular: Negative for chest pain.  Gastrointestinal: Negative for vomiting, abdominal pain and diarrhea.  Genitourinary: Negative for dysuria and flank pain.  Musculoskeletal: Negative for back pain and neck pain.  Skin: Negative for rash.  Neurological: Negative for headaches.  Hematological: Does not bruise/bleed easily.  Psychiatric/Behavioral: Negative for confusion. The patient is nervous/anxious.       Allergies  Review of patient's  allergies indicates no known allergies.  Home Medications   Prior to Admission medications   Medication Sig Start Date End Date Taking? Authorizing Provider  HYDROcodone-acetaminophen (NORCO/VICODIN) 5-325 MG per tablet Take 1-2 tablets every 6 hours as needed for severe pain 10/23/13   Renne Crigler, PA-C  levonorgestrel (MIRENA) 20 MCG/24HR IUD 1 each by Intrauterine route once.    Historical Provider, MD  naproxen (NAPROSYN) 500 MG tablet Take 1 tablet (500 mg total) by mouth 2 (two) times daily. 10/23/13   Renne Crigler, PA-C  penicillin v potassium (VEETID) 500 MG tablet Take 1 tablet (500 mg total) by mouth 3 (three) times daily. 10/23/13   Renne Crigler, PA-C   BP 120/91 mmHg  Pulse 76  Temp(Src) 98 F (36.7 C) (Oral)  Resp 17  Ht  (1.676 m)  Wt 242 lb (109.77 kg)  BMI 39.08 kg/m2  SpO2 98%  LMP 02/05/2014 Physical Exam  Constitutional: She is oriented to person, place, and time. She appears well-developed and well-nourished. No distress.  HENT:  Mouth/Throat: Oropharynx is clear and moist.  Eyes: Conjunctivae are normal. Pupils are equal, round, and reactive to light. No scleral icterus.  Neck: Neck supple. No tracheal deviation present. No thyromegaly present.  No stiffness or rigidity  Cardiovascular: Normal rate, regular rhythm, normal heart sounds and intact distal pulses.   Pulmonary/Chest: Effort normal and breath sounds normal. No respiratory distress.  Abdominal: Soft. Normal appearance and bowel sounds are normal. She exhibits no distension. There is no tenderness.  Genitourinary:  No cva tenderness  Musculoskeletal: She  exhibits no edema.  Lymphadenopathy:    She has no cervical adenopathy.  Neurological: She is alert and oriented to person, place, and time.  Steady gait.   Skin: Skin is warm and dry. No rash noted. She is not diaphoretic.  Psychiatric:  Mildly anxious.   Nursing note and vitals reviewed.   ED Course  Procedures (including critical  care time) Labs Review   Results for orders placed or performed during the hospital encounter of 02/17/14  Comprehensive metabolic panel  Result Value Ref Range   Sodium 138 135 - 145 mmol/L   Potassium 4.4 3.5 - 5.1 mmol/L   Chloride 105 96 - 112 mEq/L   CO2 25 19 - 32 mmol/L   Glucose, Bld 91 70 - 99 mg/dL   BUN 10 6 - 23 mg/dL   Creatinine, Ser 0.980.92 0.50 - 1.10 mg/dL   Calcium 9.4 8.4 - 11.910.5 mg/dL   Total Protein 7.4 6.0 - 8.3 g/dL   Albumin 3.9 3.5 - 5.2 g/dL   AST 18 0 - 37 U/L   ALT 15 0 - 35 U/L   Alkaline Phosphatase 77 39 - 117 U/L   Total Bilirubin 0.2 (L) 0.3 - 1.2 mg/dL   GFR calc non Af Amer 86 (L) >90 mL/min   GFR calc Af Amer >90 >90 mL/min   Anion gap 8 5 - 15  CBC  Result Value Ref Range   WBC 6.7 4.0 - 10.5 K/uL   RBC 4.95 3.87 - 5.11 MIL/uL   Hemoglobin 14.5 12.0 - 15.0 g/dL   HCT 14.743.7 82.936.0 - 56.246.0 %   MCV 88.3 78.0 - 100.0 fL   MCH 29.3 26.0 - 34.0 pg   MCHC 33.2 30.0 - 36.0 g/dL   RDW 13.013.3 86.511.5 - 78.415.5 %   Platelets 328 150 - 400 K/uL      MDM   Labs.  Pt took bus here. States feels anxious.  Ativan 1 mg po.  Also provided reassurance re symptoms.  Reviewed nursing notes and prior charts for additional history.    Recheck pt comfortable, nad. Pt appears stable for d/c.      Suzi RootsKevin E Tivon Lemoine, MD 02/17/14 [phone]

## 2015-01-15 ENCOUNTER — Encounter: Payer: Self-pay | Admitting: Emergency Medicine

## 2015-01-15 ENCOUNTER — Emergency Department
Admission: EM | Admit: 2015-01-15 | Discharge: 2015-01-15 | Disposition: A | Discharge status: Home / Self Care | Payer: Self-pay | Attending: Emergency Medicine | Admitting: Emergency Medicine

## 2015-01-15 DIAGNOSIS — Z79899 Other long term (current) drug therapy: Secondary | ICD-10-CM | POA: Insufficient documentation

## 2015-01-15 DIAGNOSIS — Z792 Long term (current) use of antibiotics: Secondary | ICD-10-CM | POA: Insufficient documentation

## 2015-01-15 DIAGNOSIS — F17210 Nicotine dependence, cigarettes, uncomplicated: Secondary | ICD-10-CM | POA: Insufficient documentation

## 2015-01-15 DIAGNOSIS — K032 Erosion of teeth: Secondary | ICD-10-CM | POA: Insufficient documentation

## 2015-01-15 DIAGNOSIS — Z791 Long term (current) use of non-steroidal anti-inflammatories (NSAID): Secondary | ICD-10-CM | POA: Insufficient documentation

## 2015-01-15 DIAGNOSIS — B084 Enteroviral vesicular stomatitis with exanthem: Secondary | ICD-10-CM | POA: Insufficient documentation

## 2015-01-15 MED ORDER — MAGIC MOUTHWASH W/LIDOCAINE: 5.0000 mL | Freq: Four times a day (QID) | ORAL | Status: DC

## 2015-01-15 NOTE — Discharge Instructions (Signed)
Dental Care and Dentist Visits Dental care supports good overall health. Regular dental visits can also help you avoid dental pain, bleeding, infection, and other more serious health problems in the future. It is important to keep the mouth healthy because diseases in the teeth, gums, and other oral tissues can spread to other areas of the body. Some problems, such as diabetes, heart disease, and pre-term labor have been associated with poor oral health.  See your dentist every 6 months. If you experience emergency problems such as a toothache or broken tooth, go to the dentist right away. If you see your dentist regularly, you may catch problems early. It is easier to be treated for problems in the early stages.  WHAT TO EXPECT AT A DENTIST VISIT  Your dentist will look for many common oral health problems and recommend proper treatment. At your regular dental visit, you can expect:  Gentle cleaning of the teeth and gums. This includes scraping and polishing. This helps to remove the sticky substance around the teeth and gums (plaque). Plaque forms in the mouth shortly after eating. Over time, plaque hardens on the teeth as tartar. If tartar is not removed regularly, it can cause problems. Cleaning also helps remove stains.  Periodic X-rays. These pictures of the teeth and supporting bone will help your dentist assess the health of your teeth.  Periodic fluoride treatments. Fluoride is a natural mineral shown to help strengthen teeth. Fluoride treatmentinvolves applying a fluoride gel or varnish to the teeth. It is most commonly done in children.  Examination of the mouth, tongue, jaws, teeth, and gums to look for any oral health problems, such as:  Cavities (dental caries). This is decay on the tooth caused by plaque, sugar, and acid in the mouth. It is best to catch a cavity when it is small.  Inflammation of the gums caused by plaque buildup (gingivitis).  Problems with the mouth or malformed  or misaligned teeth.  Oral cancer or other diseases of the soft tissues or jaws. KEEP YOUR TEETH AND GUMS HEALTHY For healthy teeth and gums, follow these general guidelines as well as your dentist's specific advice:  Have your teeth professionally cleaned at the dentist every 6 months.  Brush twice daily with a fluoride toothpaste.  Floss your teeth daily.  Ask your dentist if you need fluoride supplements, treatments, or fluoride toothpaste.  Eat a healthy diet. Reduce foods and drinks with added sugar.  Avoid smoking. TREATMENT FOR ORAL HEALTH PROBLEMS If you have oral health problems, treatment varies depending on the conditions present in your teeth and gums.  Your caregiver will most likely recommend good oral hygiene at each visit.  For cavities, gingivitis, or other oral health disease, your caregiver will perform a procedure to treat the problem. This is typically done at a separate appointment. Sometimes your caregiver will refer you to another dental specialist for specific tooth problems or for surgery. SEEK IMMEDIATE DENTAL CARE IF:  You have pain, bleeding, or soreness in the gum, tooth, jaw, or mouth area.  A permanent tooth becomes loose or separated from the gum socket.  You experience a blow or injury to the mouth or jaw area.   This information is not intended to replace advice given to you by your health care provider. Make sure you discuss any questions you have with your health care provider.   Document Released: 10/07/2010 Document Revised: 04/19/2011 Document Reviewed: 10/07/2010 Elsevier Interactive Patient Education 2016 [employer]  Stomatitis Stomatitis is  a condition that causes inflammation in your mouth. It can affect a part of your mouth or your whole mouth. The condition often affects your cheek, teeth, gums, lips, and tongue. Stomatitis can also affect the mucous membranes that surround your mouth (mucosa). Pain from stomatitis can make it  hard for you to eat or drink. Severe cases of this condition can lead to dehydration or poor nutrition. CAUSES Common causes of this condition include:  Viruses, such as cold sores or oral herpes and shingles.  Canker sores.  Bacterial infections.  Fungus or yeast infections, such as oral thrush.  Not getting adequate nutrition.  Injury to your mouth. This can be from:  Dentures or braces that do not fit well.  Biting your tongue or cheek.  Burning your mouth.  Having sharp or broken teeth.  Gum disease.  Using tobacco, especially chewing tobacco.  Allergies to foods, medicines, or substances that are used in your mouth.  Medicines, including cancer medicines (chemotherapy), antihistamines, and seizure medicines. In some cases, the cause may not be known. RISK FACTORS This condition is more likely to develop in people who:  Have poor oral hygiene or poor nutrition.  Have any condition that causes a dry mouth.  Are under a lot of physical or emotional stress.  Have any condition that weakens the body's defense system (immune system).  Are being treated for cancer.  Smoke. SYMPTOMS The most common symptoms of this condition are pain, swelling, and redness inside your mouth. The pain may feel like burning or stinging. It may get worse from eating or drinking. Other symptoms include:  Painful, shallow sores (ulcers) in the mouth.  Blisters in the mouth.  Bleeding gums.  Swollen gums.  Irritability and fatigue.  Bad breath.  Bad taste in the mouth.  Fever. DIAGNOSIS This condition is diagnosed with a physical exam to check for bleeding gums and mouth ulcers. You may also have other tests, including:  Blood tests to look for infection or vitamin deficiencies.  Mouth swab to get a fluid sample to test for bacteria (culture).  Tissue sample from an ulcer to examine under a microscope (biopsy). TREATMENT Treatment for stomatitis depends on the cause.  Treatment may include medicines, such as:  Over-the counter (OTC) pain medicines.  Topical anesthetic to numb the area if you have severe pain.  Antibiotics to treat a bacterial infection.  Antifungals to treat a fungal infection.  Antivirals to treat a viral infection.  Mouth rinses that contain steroids to reduce the swelling in your mouth.  Other medicines to coat or numb your mouth. HOME CARE INSTRUCTIONS Medicines  Take medicines only as directed by your health care provider.  If you were prescribed an antibiotic, finish all of it even if you start to feel better. Lifestyle  Practice good oral hygiene:  Gently brush your teeth with a soft, nylon-bristled toothbrush two times each day.  Floss your teeth every day.  Have your teeth cleaned regularly, as recommended by your dentist.  Eat a balanced diet.Do not eat:  Spicy foods.  Citrus, such as oranges.  Foods that have sharp edges, such as chips.  Avoid any foods or other allergens that you think may be causing your stomatitis.  If you have dentures, make sure that they are properly fitted.  Do not use any tobacco products, including cigarettes, chewing tobacco, or electronic cigarettes. If you need help quitting, ask your health care provider.  Find ways to reduce stress. Try yoga or meditation.  Ask your health care provider for other ideas. General Instructions  Use a salt-water rinse for pain as directed by your health care provider. Mix 1 tsp of salt in 2 cups of water.  Drink enough fluid to keep your urine clear or pale yellow. This will keep you hydrated. SEEK MEDICAL CARE IF:  Your symptoms get worse.  You develop new symptoms, especially:  A rash.  New symptoms that do not involve your mouth area.  Your symptoms last longer than three weeks.  Your stomatitis goes away and then returns.  You have a harder time eating and drinking normally.  You have increasing fatigue or weakness.  You  lose your appetite or you feel nauseous.  You have a fever.   This information is not intended to replace advice given to you by your health care provider. Make sure you discuss any questions you have with your health care provider.   Document Released: 11/22/2006 Document Revised: 06/11/2014 Document Reviewed: 01/21/2014 Elsevier Interactive Patient Education [employer]

## 2015-01-15 NOTE — ED Notes (Addendum)
Pt reports dental pain and sore throat x3 days. Pt with blisters to palms of hands and feet.

## 2015-01-15 NOTE — ED Provider Notes (Signed)
[facility] Emergency Department Provider Note  ____________________________________________  Time seen: Approximately 10:27 AM  I have reviewed the triage vital signs and the nursing notes.   HISTORY  Chief Complaint Dental Pain and Sore Throat    HPI Amber Bray is a 24 y.o. female who presents to the emergency department complaining of left upper dental pain and sore throat 3 days. Patient states that the dental pain has been ongoing for some time. She states that she has Medicaid from [place] but is unable to see a dentist in [place] and does not want to return to [place] to see her dentist.Patient states that she has tried Tylenol and ibuprofen for this complaint with no relief. Patient is also endorsing some sore throat 3 days. She endorses ulcerations on hands and feet starting at the same time. She denies nasal congestion, headaches, visual acuity changes, difficulty breathing or swallowing, chest pain, abdominal pain, nausea or vomiting.   History reviewed. No pertinent past medical history.  There are no active problems to display for this patient.   History reviewed. No pertinent past surgical history.  Current Outpatient Rx  Name  Route  Sig  Dispense  Refill  . HYDROcodone-acetaminophen (NORCO/VICODIN) 5-325 MG per tablet      Take 1-2 tablets every 6 hours as needed for severe pain   8 tablet   0   . levonorgestrel (MIRENA) 20 MCG/24HR IUD   Intrauterine   1 each by Intrauterine route once.         . magic mouthwash w/lidocaine SOLN   Oral   Take 5 mLs by mouth 4 (four) times daily.   240 mL   0     Dispense in a 1/1/1/1 ratio. Use lidocaine, diphen ...   . naproxen (NAPROSYN) 500 MG tablet   Oral   Take 1 tablet (500 mg total) by mouth 2 (two) times daily.   20 tablet   0   . penicillin v potassium (VEETID) 500 MG tablet   Oral   Take 1 tablet (500 mg total) by mouth 3 (three) times daily.   21 tablet   0      Allergies Review of patient's allergies indicates no known allergies.  No family history on file.  Social History Social History  Substance Use Topics  . Smoking status: Current Every Day Smoker -- 0.50 packs/day    Types: Cigarettes  . Smokeless tobacco: None  . Alcohol Use: No    Review of Systems Constitutional: No fever/chills Eyes: No visual changes. ENT: Endorses sore throat. Endorses left upper dental pain. Cardiovascular: Denies chest pain. Respiratory: Denies shortness of breath. Gastrointestinal: No abdominal pain.  No nausea, no vomiting.  No diarrhea.  No constipation. Genitourinary: Negative for dysuria. Musculoskeletal: Negative for back pain. Skin: Endorses blisters to bilateral palms and bilateral soles of her feet. Neurological: Negative for headaches, focal weakness or numbness.  10-point ROS otherwise negative.  ____________________________________________   PHYSICAL EXAM:  VITAL SIGNS: ED Triage Vitals  Enc Vitals Group     BP 01/15/15 0951 126/87 mmHg     Pulse Rate 01/15/15 0951 77     Resp 01/15/15 0951 16     Temp 01/15/15 0951 98.6 F (37 C)     Temp Source 01/15/15 0951 Oral     SpO2 01/15/15 0951 100 %     Weight 01/15/15 0951 253 lb (114.76 kg)     Height 01/15/15 0951  (1.676 m)  Head Cir --      Peak Flow --      Pain Score 01/15/15 0951 7     Pain Loc --      Pain Edu? --      Excl. in GC? --     Constitutional: Alert and oriented. Well appearing and in no acute distress. Eyes: Conjunctivae are normal. PERRL. EOMI. Head: Atraumatic. Nose: No congestion/rhinnorhea. Mouth/Throat: Mucous membranes are moist.  Oropharynx is erythematous with scattered oral ulcers.. Dental erosion into the pulp is identified in the second molar left upper dentition. No surrounding erythema or edema. Neck: No stridor.   Hematological/Lymphatic/Immunilogical: No cervical lymphadenopathy. Cardiovascular: Normal rate, regular rhythm.  Grossly normal heart sounds.  Good peripheral circulation. Respiratory: Normal respiratory effort.  No retractions. Lungs CTAB. Gastrointestinal: Soft and nontender. No distention. No abdominal bruits. No CVA tenderness. Musculoskeletal: No lower extremity tenderness nor edema.  No joint effusions. Neurologic:  Normal speech and language. No gross focal neurologic deficits are appreciated. No gait instability. Skin:  Skin is warm, dry and intact. No rash noted. Blisters noted on bilateral palms and bilateral plantar aspect of feet. Psychiatric: Mood and affect are normal. Speech and behavior are normal.  ____________________________________________   LABS (all labs ordered are listed, but only abnormal results are displayed)  Labs Reviewed - No data to display ____________________________________________  EKG   ____________________________________________  RADIOLOGY   ____________________________________________   PROCEDURES  Procedure(s) performed: None  Critical Care performed: No  ____________________________________________   INITIAL IMPRESSION / ASSESSMENT AND PLAN / ED COURSE  Pertinent labs & imaging results that were available during my care of the patient were reviewed by me and considered in my medical decision making (see chart for details).  Patient diagnosis consistent with hand-foot-and-mouth disease and dental erosion to the left upper dentition. Advised patient of findings and diagnosis. She verbalizes that she does not want a dental block at this time. Patient will be placed on Magic mouthwash for symptomatic control of both hand-foot-and-mouth as well as dental erosion. [facility]. Patient is to take Tylenol and ibuprofen at home for additional symptomatic control. ____________________________________________   FINAL CLINICAL IMPRESSION(S) / ED DIAGNOSES  Final diagnoses:  Dental erosion  extending into pulp  Hand, foot and mouth disease      Racheal PatchesJonathan D Azyiah Bo, PA-C 01/15/15 1052  Sharman CheekPhillip Stafford, MD 01/15/15 1558

## 2015-03-20 ENCOUNTER — Emergency Department (HOSPITAL_COMMUNITY)
Admission: EM | Admit: 2015-03-20 | Discharge: 2015-03-20 | Disposition: A | Discharge status: Home / Self Care | Payer: Self-pay | Attending: Emergency Medicine | Admitting: Emergency Medicine

## 2015-03-20 ENCOUNTER — Encounter (HOSPITAL_COMMUNITY): Payer: Self-pay | Admitting: Emergency Medicine

## 2015-03-20 ENCOUNTER — Emergency Department (HOSPITAL_COMMUNITY): Payer: Self-pay

## 2015-03-20 DIAGNOSIS — S39012A Strain of muscle, fascia and tendon of lower back, initial encounter: Secondary | ICD-10-CM | POA: Insufficient documentation

## 2015-03-20 DIAGNOSIS — Z792 Long term (current) use of antibiotics: Secondary | ICD-10-CM | POA: Insufficient documentation

## 2015-03-20 DIAGNOSIS — Z79899 Other long term (current) drug therapy: Secondary | ICD-10-CM | POA: Insufficient documentation

## 2015-03-20 DIAGNOSIS — Y92410 Unspecified street and highway as the place of occurrence of the external cause: Secondary | ICD-10-CM | POA: Insufficient documentation

## 2015-03-20 DIAGNOSIS — Y998 Other external cause status: Secondary | ICD-10-CM | POA: Insufficient documentation

## 2015-03-20 DIAGNOSIS — F17210 Nicotine dependence, cigarettes, uncomplicated: Secondary | ICD-10-CM | POA: Insufficient documentation

## 2015-03-20 DIAGNOSIS — S79912A Unspecified injury of left hip, initial encounter: Secondary | ICD-10-CM | POA: Insufficient documentation

## 2015-03-20 DIAGNOSIS — Z791 Long term (current) use of non-steroidal anti-inflammatories (NSAID): Secondary | ICD-10-CM | POA: Insufficient documentation

## 2015-03-20 DIAGNOSIS — Y9389 Activity, other specified: Secondary | ICD-10-CM | POA: Insufficient documentation

## 2015-03-20 MED ORDER — HYDROCODONE-ACETAMINOPHEN 5-325 MG PO TABS
1.0000 | ORAL_TABLET | Freq: Once | ORAL | Status: AC
  Administered 2015-03-20: 1 via ORAL
  Filled 2015-03-20: qty 1

## 2015-03-20 MED ORDER — HYDROCODONE-ACETAMINOPHEN 5-325 MG PO TABS: ORAL_TABLET | ORAL | Status: DC
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
## 2015-03-20 NOTE — ED Notes (Signed)
See PA assessment 

## 2015-03-20 NOTE — Discharge Instructions (Signed)
Take vicodin for breakthrough pain, do not drink alcohol, drive, care for children or do other critical tasks while taking vicodin.  Do not hesitate to return to the emergency room for any new, worsening or concerning symptoms.  Please obtain primary care using resource guide below. Let them know that you were seen in the emergency room and that they will need to obtain records for further outpatient management.   Low Back Strain With Rehab A strain is an injury in which a tendon or muscle is torn. The muscles and tendons of the lower back are vulnerable to strains. However, these muscles and tendons are very strong and require a great force to be injured. Strains are classified into three categories. Grade 1 strains cause pain, but the tendon is not lengthened. Grade 2 strains include a lengthened ligament, due to the ligament being stretched or partially ruptured. With grade 2 strains there is still function, although the function may be decreased. Grade 3 strains involve a complete tear of the tendon or muscle, and function is usually impaired. SYMPTOMS   Pain in the lower back.  Pain that affects one side more than the other.  Pain that gets worse with movement and may be felt in the hip, buttocks, or back of the thigh.  Muscle spasms of the muscles in the back.  Swelling along the muscles of the back.  Loss of strength of the back muscles.  Crackling sound (crepitation) when the muscles are touched. CAUSES  Lower back strains occur when a force is placed on the muscles or tendons that is greater than they can handle. Common causes of injury include:  Prolonged overuse of the muscle-tendon units in the lower back, usually from incorrect posture.  A single violent injury or force applied to the back. RISK INCREASES WITH:  Sports that involve twisting forces on the spine or a lot of bending at the waist (football, rugby, weightlifting, bowling, golf, tennis, speed skating,  racquetball, swimming, running, gymnastics, diving).  Poor strength and flexibility.  Failure to warm up properly before activity.  Family history of lower back pain or disk disorders.  Previous back injury or surgery (especially fusion).  Poor posture with lifting, especially heavy objects.  Prolonged sitting, especially with poor posture. PREVENTION   Learn and use proper posture when sitting or lifting (maintain proper posture when sitting, lift using the knees and legs, not at the waist).  Warm up and stretch properly before activity.  Allow for adequate recovery between workouts.  Maintain physical fitness:  Strength, flexibility, and endurance.  Cardiovascular fitness. PROGNOSIS  If treated properly, lower back strains usually heal within 6 weeks. RELATED COMPLICATIONS   Recurring symptoms, resulting in a chronic problem.  Chronic inflammation, scarring, and partial muscle-tendon tear.  Delayed healing or resolution of symptoms.  Prolonged disability. TREATMENT  Treatment first involves the use of ice and medicine, to reduce pain and inflammation. The use of strengthening and stretching exercises may help reduce pain with activity. These exercises may be performed at home or with a therapist. Severe injuries may require referral to a therapist for further evaluation and treatment, such as ultrasound. Your caregiver may advise that you wear a back brace or corset, to help reduce pain and discomfort. Often, prolonged bed rest results in greater harm then benefit. Corticosteroid injections may be recommended. However, these should be reserved for the most serious cases. It is important to avoid using your back when lifting objects. At night, sleep on your back  on a firm mattress with a pillow placed under your knees. If non-surgical treatment is unsuccessful, surgery may be needed.  MEDICATION   If pain medicine is needed, nonsteroidal anti-inflammatory medicines (aspirin  and ibuprofen), or other minor pain relievers (acetaminophen), are often advised.  Do not take pain medicine for 7 days before surgery.  Prescription pain relievers may be given, if your caregiver thinks they are needed. Use only as directed and only as much as you need.  Ointments applied to the skin may be helpful.  Corticosteroid injections may be given by your caregiver. These injections should be reserved for the most serious cases, because they may only be given a certain number of times. HEAT AND COLD  Cold treatment (icing) should be applied for 10 to 15 minutes every 2 to 3 hours for inflammation and pain, and immediately after activity that aggravates your symptoms. Use ice packs or an ice massage.  Heat treatment may be used before performing stretching and strengthening activities prescribed by your caregiver, physical therapist, or athletic trainer. Use a heat pack or a warm water soak. SEEK MEDICAL CARE IF:   Symptoms get worse or do not improve in 2 to 4 weeks, despite treatment.  You develop numbness, weakness, or loss of bowel or bladder function.  New, unexplained symptoms develop. (Drugs used in treatment may produce side effects.) EXERCISES  RANGE OF MOTION (ROM) AND STRETCHING EXERCISES - Low Back Strain Most people with lower back pain will find that their symptoms get worse with excessive bending forward (flexion) or arching at the lower back (extension). The exercises which will help resolve your symptoms will focus on the opposite motion.  Your physician, physical therapist or athletic trainer will help you determine which exercises will be most helpful to resolve your lower back pain. Do not complete any exercises without first consulting with your caregiver. Discontinue any exercises which make your symptoms worse until you speak to your caregiver.  If you have pain, numbness or tingling which travels down into your buttocks, leg or foot, the goal of the therapy is  for these symptoms to move closer to your back and eventually resolve. Sometimes, these leg symptoms will get better, but your lower back pain may worsen. This is typically an indication of progress in your rehabilitation. Be very alert to any changes in your symptoms and the activities in which you participated in the 24 hours prior to the change. Sharing this information with your caregiver will allow him/her to most efficiently treat your condition.  These exercises may help you when beginning to rehabilitate your injury. Your symptoms may resolve with or without further involvement from your physician, physical therapist or athletic trainer. While completing these exercises, remember:  Restoring tissue flexibility helps normal motion to return to the joints. This allows healthier, less painful movement and activity.  An effective stretch should be held for at least 30 seconds.  A stretch should never be painful. You should only feel a gentle lengthening or release in the stretched tissue. FLEXION RANGE OF MOTION AND STRETCHING EXERCISES: STRETCH - Flexion, Single Knee to Chest   Lie on a firm bed or floor with both legs extended in front of you.  Keeping one leg in contact with the floor, bring your opposite knee to your chest. Hold your leg in place by either grabbing behind your thigh or at your knee.  Pull until you feel a gentle stretch in your lower back. Hold __________ seconds.  Slowly release  your grasp and repeat the exercise with the opposite side. Repeat __________ times. Complete this exercise __________ times per day.  STRETCH - Flexion, Double Knee to Chest   Lie on a firm bed or floor with both legs extended in front of you.  Keeping one leg in contact with the floor, bring your opposite knee to your chest.  Tense your stomach muscles to support your back and then lift your other knee to your chest. Hold your legs in place by either grabbing behind your thighs or at your  knees.  Pull both knees toward your chest until you feel a gentle stretch in your lower back. Hold __________ seconds.  Tense your stomach muscles and slowly return one leg at a time to the floor. Repeat __________ times. Complete this exercise __________ times per day.  STRETCH - Low Trunk Rotation  Lie on a firm bed or floor. Keeping your legs in front of you, bend your knees so they are both pointed toward the ceiling and your feet are flat on the floor.  Extend your arms out to the side. This will stabilize your upper body by keeping your shoulders in contact with the floor.  Gently and slowly drop both knees together to one side until you feel a gentle stretch in your lower back. Hold for __________ seconds.  Tense your stomach muscles to support your lower back as you bring your knees back to the starting position. Repeat the exercise to the other side. Repeat __________ times. Complete this exercise __________ times per day  EXTENSION RANGE OF MOTION AND FLEXIBILITY EXERCISES: STRETCH - Extension, Prone on Elbows   Lie on your stomach on the floor, a bed will be too soft. Place your palms about shoulder width apart and at the height of your head.  Place your elbows under your shoulders. If this is too painful, stack pillows under your chest.  Allow your body to relax so that your hips drop lower and make contact more completely with the floor.  Hold this position for __________ seconds.  Slowly return to lying flat on the floor. Repeat __________ times. Complete this exercise __________ times per day.  RANGE OF MOTION - Extension, Prone Press Ups  Lie on your stomach on the floor, a bed will be too soft. Place your palms about shoulder width apart and at the height of your head.  Keeping your back as relaxed as possible, slowly straighten your elbows while keeping your hips on the floor. You may adjust the placement of your hands to maximize your comfort. As you gain motion,  your hands will come more underneath your shoulders.  Hold this position __________ seconds.  Slowly return to lying flat on the floor. Repeat __________ times. Complete this exercise __________ times per day.  RANGE OF MOTION- Quadruped, Neutral Spine   Assume a hands and knees position on a firm surface. Keep your hands under your shoulders and your knees under your hips. You may place padding under your knees for comfort.  Drop your head and point your tail bone toward the ground below you. This will round out your lower back like an angry cat. Hold this position for __________ seconds.  Slowly lift your head and release your tail bone so that your back sags into a large arch, like an old horse.  Hold this position for __________ seconds.  Repeat this until you feel limber in your lower back.  Now, find your "sweet spot." This will be the  most comfortable position somewhere between the two previous positions. This is your neutral spine. Once you have found this position, tense your stomach muscles to support your lower back.  Hold this position for __________ seconds. Repeat __________ times. Complete this exercise __________ times per day.  STRENGTHENING EXERCISES - Low Back Strain These exercises may help you when beginning to rehabilitate your injury. These exercises should be done near your "sweet spot." This is the neutral, low-back arch, somewhere between fully rounded and fully arched, that is your least painful position. When performed in this safe range of motion, these exercises can be used for people who have either a flexion or extension based injury. These exercises may resolve your symptoms with or without further involvement from your physician, physical therapist or athletic trainer. While completing these exercises, remember:   Muscles can gain both the endurance and the strength needed for everyday activities through controlled exercises.  Complete these exercises as  instructed by your physician, physical therapist or athletic trainer. Increase the resistance and repetitions only as guided.  You may experience muscle soreness or fatigue, but the pain or discomfort you are trying to eliminate should never worsen during these exercises. If this pain does worsen, stop and make certain you are following the directions exactly. If the pain is still present after adjustments, discontinue the exercise until you can discuss the trouble with your caregiver. STRENGTHENING - Deep Abdominals, Pelvic Tilt  Lie on a firm bed or floor. Keeping your legs in front of you, bend your knees so they are both pointed toward the ceiling and your feet are flat on the floor.  Tense your lower abdominal muscles to press your lower back into the floor. This motion will rotate your pelvis so that your tail bone is scooping upwards rather than pointing at your feet or into the floor.  With a gentle tension and even breathing, hold this position for __________ seconds. Repeat __________ times. Complete this exercise __________ times per day.  STRENGTHENING - Abdominals, Crunches   Lie on a firm bed or floor. Keeping your legs in front of you, bend your knees so they are both pointed toward the ceiling and your feet are flat on the floor. Cross your arms over your chest.  Slightly tip your chin down without bending your neck.  Tense your abdominals and slowly lift your trunk high enough to just clear your shoulder blades. Lifting higher can put excessive stress on the lower back and does not further strengthen your abdominal muscles.  Control your return to the starting position. Repeat __________ times. Complete this exercise __________ times per day.  STRENGTHENING - Quadruped, Opposite UE/LE Lift   Assume a hands and knees position on a firm surface. Keep your hands under your shoulders and your knees under your hips. You may place padding under your knees for comfort.  Find your  neutral spine and gently tense your abdominal muscles so that you can maintain this position. Your shoulders and hips should form a rectangle that is parallel with the floor and is not twisted.  Keeping your trunk steady, lift your right hand no higher than your shoulder and then your left leg no higher than your hip. Make sure you are not holding your breath. Hold this position __________ seconds.  Continuing to keep your abdominal muscles tense and your back steady, slowly return to your starting position. Repeat with the opposite arm and leg. Repeat __________ times. Complete this exercise __________ times per day.  STRENGTHENING - Lower Abdominals, Double Knee Lift  Lie on a firm bed or floor. Keeping your legs in front of you, bend your knees so they are both pointed toward the ceiling and your feet are flat on the floor.  Tense your abdominal muscles to brace your lower back and slowly lift both of your knees until they come over your hips. Be certain not to hold your breath.  Hold __________ seconds. Using your abdominal muscles, return to the starting position in a slow and controlled manner. Repeat __________ times. Complete this exercise __________ times per day.  POSTURE AND BODY MECHANICS CONSIDERATIONS - Low Back Strain Keeping correct posture when sitting, standing or completing your activities will reduce the stress put on different body tissues, allowing injured tissues a chance to heal and limiting painful experiences. The following are general guidelines for improved posture. Your physician or physical therapist will provide you with any instructions specific to your needs. While reading these guidelines, remember:  The exercises prescribed by your provider will help you have the flexibility and strength to maintain correct postures.  The correct posture provides the best environment for your joints to work. All of your joints have less wear and tear when properly supported by a  spine with good posture. This means you will experience a healthier, less painful body.  Correct posture must be practiced with all of your activities, especially prolonged sitting and standing. Correct posture is as important when doing repetitive low-stress activities (typing) as it is when doing a single heavy-load activity (lifting). RESTING POSITIONS Consider which positions are most painful for you when choosing a resting position. If you have pain with flexion-based activities (sitting, bending, stooping, squatting), choose a position that allows you to rest in a less flexed posture. You would want to avoid curling into a fetal position on your side. If your pain worsens with extension-based activities (prolonged standing, working overhead), avoid resting in an extended position such as sleeping on your stomach. Most people will find more comfort when they rest with their spine in a more neutral position, neither too rounded nor too arched. Lying on a non-sagging bed on your side with a pillow between your knees, or on your back with a pillow under your knees will often provide some relief. Keep in mind, being in any one position for a prolonged period of time, no matter how correct your posture, can still lead to stiffness. PROPER SITTING POSTURE In order to minimize stress and discomfort on your spine, you must sit with correct posture. Sitting with good posture should be effortless for a healthy body. Returning to good posture is a gradual process. Many people can work toward this most comfortably by using various supports until they have the flexibility and strength to maintain this posture on their own. When sitting with proper posture, your ears will fall over your shoulders and your shoulders will fall over your hips. You should use the back of the chair to support your upper back. Your lower back will be in a neutral position, just slightly arched. You may place a small pillow or folded towel  at the base of your lower back for support.  When working at a desk, create an environment that supports good, upright posture. Without extra support, muscles tire, which leads to excessive strain on joints and other tissues. Keep these recommendations in mind: CHAIR:  A chair should be able to slide under your desk when your back makes contact with the back  of the chair. This allows you to work closely.  The chair's height should allow your eyes to be level with the upper part of your monitor and your hands to be slightly lower than your elbows. BODY POSITION  Your feet should make contact with the floor. If this is not possible, use a foot rest.  Keep your ears over your shoulders. This will reduce stress on your neck and lower back. INCORRECT SITTING POSTURES  If you are feeling tired and unable to assume a healthy sitting posture, do not slouch or slump. This puts excessive strain on your back tissues, causing more damage and pain. Healthier options include:  Using more support, like a lumbar pillow.  Switching tasks to something that requires you to be upright or walking.  Talking a brief walk.  Lying down to rest in a neutral-spine position. PROLONGED STANDING WHILE SLIGHTLY LEANING FORWARD  When completing a task that requires you to lean forward while standing in one place for a long time, place either foot up on a stationary 2-4 inch high object to help maintain the best posture. When both feet are on the ground, the lower back tends to lose its slight inward curve. If this curve flattens (or becomes too large), then the back and your other joints will experience too much stress, tire more quickly, and can cause pain. CORRECT STANDING POSTURES Proper standing posture should be assumed with all daily activities, even if they only take a few moments, like when brushing your teeth. As in sitting, your ears should fall over your shoulders and your shoulders should fall over your hips.  You should keep a slight tension in your abdominal muscles to brace your spine. Your tailbone should point down to the ground, not behind your body, resulting in an over-extended swayback posture.  INCORRECT STANDING POSTURES  Common incorrect standing postures include a forward head, locked knees and/or an excessive swayback. WALKING Walk with an upright posture. Your ears, shoulders and hips should all line-up. PROLONGED ACTIVITY IN A FLEXED POSITION When completing a task that requires you to bend forward at your waist or lean over a low surface, try to find a way to stabilize 3 out of 4 of your limbs. You can place a hand or elbow on your thigh or rest a knee on the surface you are reaching across. This will provide you more stability so that your muscles do not fatigue as quickly. By keeping your knees relaxed, or slightly bent, you will also reduce stress across your lower back. CORRECT LIFTING TECHNIQUES DO :   Assume a wide stance. This will provide you more stability and the opportunity to get as close as possible to the object which you are lifting.  Tense your abdominals to brace your spine. Bend at the knees and hips. Keeping your back locked in a neutral-spine position, lift using your leg muscles. Lift with your legs, keeping your back straight.  Test the weight of unknown objects before attempting to lift them.  Try to keep your elbows locked down at your sides in order get the best strength from your shoulders when carrying an object.  Always ask for help when lifting heavy or awkward objects. INCORRECT LIFTING TECHNIQUES DO NOT:   Lock your knees when lifting, even if it is a small object.  Bend and twist. Pivot at your feet or move your feet when needing to change directions.  Assume that you can safely pick up even a paper clip without  proper posture.   This information is not intended to replace advice given to you by your health care provider. Make sure you discuss  any questions you have with your health care provider.   Document Released: 01/25/2005 Document Revised: 02/15/2014 Document Reviewed: 05/09/2008 Elsevier Interactive Patient Education 2016 [employer].   Emergency Department Resource Guide 1) Find a Doctor and Pay Out of Pocket Although you won't have to find out who is covered by your insurance plan, it is a good idea to ask around and get recommendations. You will then need to call the office and see if the doctor you have chosen will accept you as a new patient and what types of options they offer for patients who are self-pay. Some doctors offer discounts or will set up payment plans for their patients who do not have insurance, but you will need to ask so you aren't surprised when you get to your appointment.  2) Contact Your Local Health Department Not all health departments have doctors that can see patients for sick visits, but many do, so it is worth a call to see if yours does. If you don't know where your local health department is, you can check in your phone book. The CDC also has a tool to help you locate your state's health department, and many state websites also have listings of all of their local health departments.  3) Find a Walk-in Clinic If your illness is not likely to be very severe or complicated, you may want to try a walk in clinic. These are popping up all over the country in pharmacies, drugstores, and shopping centers. They're usually staffed by nurse practitioners or physician assistants that have been trained to treat common illnesses and complaints. They're usually fairly quick and inexpensive. However, if you have serious medical issues or chronic medical problems, these are probably not your best option.  No Primary Care Doctor: - Call [facility] at  [phone] - they can help you locate a primary care doctor that  accepts your insurance, provides certain services, etc. - Physician Referral Service-  [phone]  Chronic Pain Problems: Organization         Address  Phone   Notes  [facility]  [phone] Patients need to be referred by their primary care doctor.   Medication Assistance: Organization         Address  Phone   Notes  [facility] Medication [facility] [address] [place] [place], [place] [address] [phone] --Must be a resident of [facility] -- Must have NO insurance coverage whatsoever (no Medicaid/ Medicare, etc.) -- The pt. MUST have a primary care doctor that directs their care regularly and follows them in the community   [facility]  [phone]   [employer]  [phone]    Agencies that provide inexpensive medical care: Organization         Address  Phone   Notes  [facility]  [phone]   [facility]    [phone]   [facility] [address] [place], [place] [address] [phone]   [facility] [address] [place] [phone]   [facility]anned Parenthood    [phone]   [facility]    [phone]   [facility] [place] [facility]  [address], [place] Phone:  [phone], Fax:  [phone] Hours of Operation:  9 am - 6 pm, M-F.  Also accepts Medicaid/Medicare and self-pay.  [facility]  [address], [place] Phone: [phone], Fax: [phone]. Hours of Operation:  8:30 am - 5:30 pm, M-F.  Also accepts Medicaid and self-pay.  [facility] [place] [address] [place] Phone: [phone]   [facility] [address] Natasha [place], [place] [phone], Ext. [phone] Mondays & Thursdays: 7-9 AM.  First 15 patients are seen on a first come, first serve basis.    Medicaid-accepting [facility] Providers:  Organization         Address  Phone   Notes  [facility] [address],  [place] [phone] Also accepts self-pay patients.  [facility] [address] Laurell Josephs [place], [place]  [phone]   [facility] [address], [place] [phone]   [facility] [address], [place] [phone]   Renaye Rakers [address], [place]   [phone] Only accepts [place] patients after they have their name applied to their card.   Self-Pay (no insurance) in [facility]:  Organization         Address  Phone   Notes  Sickle Cell Patients, [facility] Internal Medicine [address] [place], [place] [phone]   [facility] Urgent Care [address] [place], [place] [phone]   [facility]  [address], [place] [phone]   [facility]/Dr. Osei-Bonsu  [address] [place] or [address], [place] [phone] Phone number for both [place] and [place] locations is the same.  [facility] and [facility] [address] [place] [phone]   [facility] [address], [place] or [address] [phone]   [facility] [address], [place] [phone], phone; [phone], fax Sees patients 1st and 3rd Saturday of every month.  Must not qualify for public or private insurance (i.e. Medicaid, Medicare, [place] Health Choice, Veterans' Benefits)  Household income should be no more than 200% of the poverty level The clinic cannot treat you if you are pregnant or think you are pregnant  Sexually transmitted diseases are not treated at the clinic.    Dental Care: Organization         Address  Phone  Notes  [facility] Department of [facility] [address] [place], [place] [phone] Accepts children up to age 53 who are enrolled in  [place] or [place] Health Choice; pregnant women with a Medicaid card; and children who have applied for Medicaid or [place] Health Choice, but were declined, whose parents can pay a reduced fee at time of service.  [facility]  [address], [place] [phone] Accepts children up to age 45 who are enrolled in [place] or [place] Health Choice; pregnant women with a Medicaid card; and children who have applied for Medicaid or [place] Health Choice, but were declined, whose parents can pay a reduced fee at time of service.  [facility]  [address] [place], [place] [phone] Patients are seen by appointment only. Walk-ins are not accepted. [facility] will see patients 18 years  of age and older. Monday - Tuesday (8am-5pm) Most Wednesdays (8:30-5pm) $30 per visit, cash only  [facility] Adult Dental Access PROGRAM  [address], [facility] [phone] Patients are seen by appointment only. Walk-ins are not accepted. [facility] will see patients 59 years of age and older. One Wednesday Evening (Monthly: Volunteer Based).  $30 per visit, cash only  [employer]  [phone] for adults; Children under age 30, call Graduate Pediatric Dentistry at [phone]. Children aged 54-14, please call [phone] to request a pediatric application.  Dental services are provided in all areas of dental care including fillings, crowns and bridges, complete and partial dentures, implants, gum treatment, root canals, and extractions. Preventive care is also provided. Treatment is provided to both adults and children. Patients are selected via a lottery and there is often a waiting list.   [employer] [address], [place]  [phone] [email or web address]   [facility] [address] [place], [place] [phone], Ext. [phone] Second and Fourth Thursday of each month, opens at 6:30  AM; Clinic ends at 9 AM.  Patients are seen on a first-come first-served basis, and a limited number are seen during each clinic.   [facility]  [address] Ether Griffins [place], [place] [phone]   Eligibility Requirements You must have lived in [place], [place], or [place] counties for at least the last three months.   You cannot be eligible for state or federal sponsored [place], including [employer], [place], or [employer].   You generally cannot be eligible for healthcare insurance through your employer.    How to apply: Eligibility screenings are held every Tuesday and Wednesday afternoon from 1:00 pm until 4:00 pm. You do not need an appointment for the interview!  [facility] [address], [place], [place] [phone]   [facility]  [phone]   [facility] Health [facility]  [phone]   [facility] Health Department  [phone]    Behavioral Health Resources in the Community: Intensive Outpatient Programs Organization         Address  Phone  Notes  [facility] [address], [place], [place] [phone]   [facility] [address], [place], [place] [phone]   [facility] [address], [place], [place]  [phone]   [facility] Mental Health [address]  [place], [place] [phone] or [phone]   Substance Abuse Resources Organization         Address  Phone  Notes  [facility] Drug Services  [phone]   [facility]  [phone]   [facility] [place]  [phone]   Floydene Flock  [phone]   Residential & Outpatient Substance Abuse Program  [phone]   Psychological Services Organization         Address  Phone  Notes  [facility] Behavioral Health  [phone]   [facility]  [phone]   [facility] [address] [place] [phone] or  [phone]    Mobile Crisis Teams Organization         Address  Phone  Notes  [facility], Mobile Crisis Care Unit  [phone]   [facility]  [address] [place], [place] [phone]   [facility] [address] [place] [phone]    Self-Help/Support Groups Organization  Address  Phone             Notes  [facility] [place] - variety of support groups  336- [phone] Call for more information  Narcotics Anonymous (NA), [facility] [address], [place]  2 meetings at this location   Statisticianesidential Treatment Programs Organization         Address  Phone  Notes  [facility] [address],    [place]  [phone]   [facility]  [address], [place] [phone], [place], [place] [phone]   [facility] [address], [place] [phone] Admissions: 8am-3pm M-F  [facility] [address]    [place], [place] [phone]   [facility] [address], [place], [place] [phone]   [facility] [address]  [place], [place] [phone]   [facility] - Intensive Outpatient [address], [place], [place] [phone]   [facility] ([facility] [address]  [place], [place] [phone] or [phone]   [facility] ([facility]) [address] [place], [place] [phone] Accepts Medicaid  [facility] [address]  [place] [phone] Substance Abuse/Addiction Treatment   [facility] Behavioral Health Resources Organization         Address  Phone  Notes  [facility]  [phone]) [phone]   Angie FavaJulie Brannon, PhD [address], [address] [place], [place]   [phone]) [phone] or [phone]   [facility]   [address] [place], [place] [phone]   [facility] [address],  [place], [place] [phone] Insurance/Medicaid/sponsorship through [facility]  [facility] [address]                                    [place], [place] [phone]) [phone] Therapy/tele-psych/case  [facility] [address].   [place], [place] [phone](336) [phone]    Dr. Lolly MustacheArfeen  [phone]) [phone]   [facility] [place]  [employer] [facility] 1) [address], [place] 2) [address], [place] 3)  [address], [place] [phone]) [phone]  [phone]) [phone]   [facility] Child Abuse Hotline [phone](336) [phone] or [phone](336) [phone] (After Hours)
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
## 2015-04-30 ENCOUNTER — Encounter (HOSPITAL_COMMUNITY): Payer: Self-pay | Admitting: Emergency Medicine

## 2015-04-30 ENCOUNTER — Emergency Department (HOSPITAL_COMMUNITY)
Admission: EM | Admit: 2015-04-30 | Discharge: 2015-04-30 | Disposition: A | Discharge status: Home / Self Care | Payer: No Typology Code available for payment source | Attending: Emergency Medicine | Admitting: Emergency Medicine

## 2015-04-30 DIAGNOSIS — S4992XA Unspecified injury of left shoulder and upper arm, initial encounter: Secondary | ICD-10-CM | POA: Diagnosis not present

## 2015-04-30 DIAGNOSIS — Y9389 Activity, other specified: Secondary | ICD-10-CM | POA: Insufficient documentation

## 2015-04-30 DIAGNOSIS — M436 Torticollis: Secondary | ICD-10-CM | POA: Diagnosis not present

## 2015-04-30 DIAGNOSIS — S4991XA Unspecified injury of right shoulder and upper arm, initial encounter: Secondary | ICD-10-CM | POA: Diagnosis not present

## 2015-04-30 DIAGNOSIS — S3992XA Unspecified injury of lower back, initial encounter: Secondary | ICD-10-CM | POA: Diagnosis not present

## 2015-04-30 DIAGNOSIS — F17210 Nicotine dependence, cigarettes, uncomplicated: Secondary | ICD-10-CM | POA: Insufficient documentation

## 2015-04-30 DIAGNOSIS — Z79899 Other long term (current) drug therapy: Secondary | ICD-10-CM | POA: Insufficient documentation

## 2015-04-30 DIAGNOSIS — Y92410 Unspecified street and highway as the place of occurrence of the external cause: Secondary | ICD-10-CM | POA: Insufficient documentation

## 2015-04-30 DIAGNOSIS — S29002A Unspecified injury of muscle and tendon of back wall of thorax, initial encounter: Secondary | ICD-10-CM | POA: Insufficient documentation

## 2015-04-30 DIAGNOSIS — S199XXA Unspecified injury of neck, initial encounter: Secondary | ICD-10-CM | POA: Diagnosis not present

## 2015-04-30 DIAGNOSIS — Z792 Long term (current) use of antibiotics: Secondary | ICD-10-CM | POA: Insufficient documentation

## 2015-04-30 DIAGNOSIS — Z791 Long term (current) use of non-steroidal anti-inflammatories (NSAID): Secondary | ICD-10-CM | POA: Diagnosis not present

## 2015-04-30 DIAGNOSIS — Y998 Other external cause status: Secondary | ICD-10-CM | POA: Insufficient documentation

## 2015-04-30 MED ORDER — METHOCARBAMOL 500 MG PO TABS: 500.0000 mg | ORAL_TABLET | Freq: Two times a day (BID) | ORAL | Status: DC

## 2015-04-30 MED ORDER — NAPROXEN 500 MG PO TABS: 500.0000 mg | ORAL_TABLET | Freq: Two times a day (BID) | ORAL | Status: DC
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
## 2015-06-22 ENCOUNTER — Emergency Department (HOSPITAL_COMMUNITY): Payer: Self-pay

## 2015-06-22 ENCOUNTER — Emergency Department (HOSPITAL_COMMUNITY)
Admission: EM | Admit: 2015-06-22 | Discharge: 2015-06-22 | Disposition: A | Discharge status: Home / Self Care | Payer: Self-pay | Attending: Emergency Medicine | Admitting: Emergency Medicine

## 2015-06-22 ENCOUNTER — Encounter (HOSPITAL_COMMUNITY): Payer: Self-pay | Admitting: *Deleted

## 2015-06-22 DIAGNOSIS — R059 Cough, unspecified: Secondary | ICD-10-CM

## 2015-06-22 DIAGNOSIS — Z79899 Other long term (current) drug therapy: Secondary | ICD-10-CM | POA: Insufficient documentation

## 2015-06-22 DIAGNOSIS — J029 Acute pharyngitis, unspecified: Secondary | ICD-10-CM | POA: Insufficient documentation

## 2015-06-22 DIAGNOSIS — Z791 Long term (current) use of non-steroidal anti-inflammatories (NSAID): Secondary | ICD-10-CM | POA: Insufficient documentation

## 2015-06-22 DIAGNOSIS — F17210 Nicotine dependence, cigarettes, uncomplicated: Secondary | ICD-10-CM | POA: Insufficient documentation

## 2015-06-22 DIAGNOSIS — R05 Cough: Secondary | ICD-10-CM

## 2015-06-22 DIAGNOSIS — Z792 Long term (current) use of antibiotics: Secondary | ICD-10-CM | POA: Insufficient documentation

## 2015-06-22 LAB — RAPID STREP SCREEN (MED CTR MEBANE ONLY): Streptococcus, Group A Screen (Direct): NEGATIVE

## 2015-06-22 MED ORDER — DEXAMETHASONE SODIUM PHOSPHATE 10 MG/ML IJ SOLN
10.0000 mg | Freq: Once | INTRAMUSCULAR | Status: AC
  Administered 2015-06-22: 10 mg via INTRAMUSCULAR
  Filled 2015-06-22: qty 1

## 2015-06-22 MED ORDER — LIDOCAINE VISCOUS 2 % MT SOLN
15.0000 mL | Freq: Once | OROMUCOSAL | Status: AC
  Administered 2015-06-22: 15 mL via OROMUCOSAL
  Filled 2015-06-22: qty 15

## 2015-06-22 MED ORDER — IBUPROFEN 400 MG PO TABS
800.0000 mg | ORAL_TABLET | Freq: Once | ORAL | Status: AC
  Administered 2015-06-22: 800 mg via ORAL
  Filled 2015-06-22: qty 2

## 2015-06-22 NOTE — Discharge Instructions (Signed)
You may take extra strength Tylenol every 6 hours. Do Not Exceed More Than 4 G of Tylenol in a 24-Hour Period. This Can Cause Liver Damage. You may take 800 mg of ibuprofen 3 times daily as well. You may alternate between Tylenol and ibuprofen. Drink plenty of fluids, 2 L daily. Follow up with the community health and wellness clinic as needed.  Pharyngitis Pharyngitis is redness, pain, and swelling (inflammation) of your pharynx.  CAUSES  Pharyngitis is usually caused by infection. Most of the time, these infections are from viruses (viral) and are part of a cold. However, sometimes pharyngitis is caused by bacteria (bacterial). Pharyngitis can also be caused by allergies. Viral pharyngitis may be spread from person to person by coughing, sneezing, and personal items or utensils (cups, forks, spoons, toothbrushes). Bacterial pharyngitis may be spread from person to person by more intimate contact, such as kissing.  SIGNS AND SYMPTOMS  Symptoms of pharyngitis include:   Sore throat.   Tiredness (fatigue).   Low-grade fever.   Headache.  Joint pain and muscle aches.  Skin rashes.  Swollen lymph nodes.  Plaque-like film on throat or tonsils (often seen with bacterial pharyngitis). DIAGNOSIS  Your health care provider will ask you questions about your illness and your symptoms. Your medical history, along with a physical exam, is often all that is needed to diagnose pharyngitis. Sometimes, a rapid strep test is done. Other lab tests may also be done, depending on the suspected cause.  TREATMENT  Viral pharyngitis will usually get better in 3-4 days without the use of medicine. Bacterial pharyngitis is treated with medicines that kill germs (antibiotics).  HOME CARE INSTRUCTIONS   Drink enough water and fluids to keep your urine clear or pale yellow.   Only take over-the-counter or prescription medicines as directed by your health care provider:   If you are prescribed  antibiotics, make sure you finish them even if you start to feel better.   Do not take aspirin.   Get lots of rest.   Gargle with 8 oz of salt water ( tsp of salt per 1 qt of water) as often as every 1-2 hours to soothe your throat.   Throat lozenges (if you are not at risk for choking) or sprays may be used to soothe your throat. SEEK MEDICAL CARE IF:   You have large, tender lumps in your neck.  You have a rash.  You cough up green, yellow-brown, or bloody spit. SEEK IMMEDIATE MEDICAL CARE IF:   Your neck becomes stiff.  You drool or are unable to swallow liquids.  You vomit or are unable to keep medicines or liquids down.  You have severe pain that does not go away with the use of recommended medicines.  You have trouble breathing (not caused by a stuffy nose). MAKE SURE YOU:   Understand these instructions.  Will watch your condition.  Will get help right away if you are not doing well or get worse.   This information is not intended to replace advice given to you by your health care provider. Make sure you discuss any questions you have with your health care provider.   Document Released: 01/25/2005 Document Revised: 11/15/2012 Document Reviewed: 10/02/2012 Elsevier Interactive Patient Education [employer]2016 Elsevier Inc.

## 2015-06-22 NOTE — ED Notes (Signed)
Pt states sore throat and URI s/s since Friday, including chills.

## 2015-06-22 NOTE — ED Provider Notes (Signed)
CSN: [id number]     Arrival date & time 06/22/15  1105 History   First MD Initiated Contact with Patient 06/22/15 1233     Chief Complaint  Patient presents with  . Sore Throat  . URI     (Consider location/radiation/quality/duration/timing/severity/associated sxs/prior Treatment) Patient is a 25 y.o. female presenting with pharyngitis and URI. The history is provided by the patient.  Sore Throat This is a new problem. The current episode started in the past 7 days. The problem occurs constantly. The problem has been unchanged. Associated symptoms include chills, congestion, coughing, a fever (subjective), headaches, myalgias, a sore throat and swollen glands. Pertinent negatives include no abdominal pain, arthralgias, change in bowel habit, chest pain, nausea, neck pain, numbness, rash, vomiting or weakness. The symptoms are aggravated by swallowing, eating and drinking. She has tried acetaminophen for the symptoms. The treatment provided mild relief.  URI Presenting symptoms: congestion, cough, fever (subjective) and sore throat   Associated symptoms: headaches, myalgias and swollen glands   Associated symptoms: no arthralgias and no neck pain     History reviewed. No pertinent past medical history. History reviewed. No pertinent past surgical history. No family history on file. Social History  Substance Use Topics  . Smoking status: Current Every Day Smoker -- 0.50 packs/day    Types: Cigarettes  . Smokeless tobacco: None  . Alcohol Use: Yes     Comment: socially   OB History    No data available     Review of Systems  Constitutional: Positive for fever (subjective) and chills.  HENT: Positive for congestion and sore throat.   Respiratory: Positive for cough.   Cardiovascular: Negative for chest pain.  Gastrointestinal: Negative for nausea, vomiting, abdominal pain, diarrhea and change in bowel habit.  Musculoskeletal: Positive for myalgias. Negative for arthralgias and  neck pain.  Skin: Negative for rash.  Neurological: Positive for headaches. Negative for weakness and numbness.  All other systems reviewed and are negative.     Allergies  Review of patient's allergies indicates no known allergies.  Home Medications   Prior to Admission medications   Medication Sig Start Date End Date Taking? Authorizing Provider  HYDROcodone-acetaminophen (NORCO/VICODIN) 5-325 MG tablet Take 1-2 tablets by mouth every 6 hours as needed for pain and/or cough. 03/20/15   Nicole Pisciotta, PA-C  levonorgestrel (MIRENA) 20 MCG/24HR IUD 1 each by Intrauterine route once.    Historical Provider, MD  magic mouthwash w/lidocaine SOLN Take 5 mLs by mouth 4 (four) times daily. 01/15/15   Delorise Royals Cuthriell, PA-C  methocarbamol (ROBAXIN) 500 MG tablet Take 1 tablet (500 mg total) by mouth 2 (two) times daily. 04/30/15   Fayrene Helper, PA-C  naproxen (NAPROSYN) 500 MG tablet Take 1 tablet (500 mg total) by mouth 2 (two) times daily. 04/30/15   Fayrene Helper, PA-C  penicillin v potassium (VEETID) 500 MG tablet Take 1 tablet (500 mg total) by mouth 3 (three) times daily. 10/23/13   Renne Crigler, PA-C   BP 116/75 mmHg  Pulse 94  Temp(Src) 99.6 F (37.6 C) (Oral)  Resp 20  SpO2 98% Physical Exam  Constitutional: She is oriented to person, place, and time. She appears well-developed and well-nourished. She is active.  Non-toxic appearance. She does not have a sickly appearance. She does not appear ill.  HENT:  Head: Normocephalic and atraumatic.  Right Ear: Tympanic membrane and external ear normal. Tympanic membrane is not erythematous and not bulging.  Left Ear: Tympanic membrane and external ear normal.  Tympanic membrane is not erythematous and not bulging.  Nose: Nose normal.  Mouth/Throat: Uvula is midline and mucous membranes are normal. No trismus in the jaw. No uvula swelling. Posterior oropharyngeal edema and posterior oropharyngeal erythema present. No oropharyngeal exudate or  tonsillar abscesses.  Tolerating secretions without difficulty.  Neck: Normal range of motion. Neck supple.  No nuchal rigidity.   Cardiovascular: Normal rate and regular rhythm.   Pulmonary/Chest: Effort normal and breath sounds normal. No respiratory distress. She has no wheezes. She has no rales.  Abdominal: Soft. Bowel sounds are normal. She exhibits no distension. There is no tenderness.  Musculoskeletal: Normal range of motion.  Lymphadenopathy:    She has cervical adenopathy.  Neurological: She is alert and oriented to person, place, and time.  Skin: Skin is warm and dry.  Psychiatric: She has a normal mood and affect. Her behavior is normal.    ED Course  Procedures (including critical care time) Labs Review Labs Reviewed  RAPID STREP SCREEN (NOT [facility])  CULTURE, GROUP A STREP [facility])    Imaging Review Dg Chest 2 View  06/22/2015  CLINICAL DATA:  Acute cough, fever and chills EXAM: CHEST  2 VIEW COMPARISON:  None. FINDINGS: The heart size and mediastinal contours are within normal limits. Both lungs are clear. The visualized skeletal structures are unremarkable. IMPRESSION: No active cardiopulmonary disease. Electronically Signed   By: Judie PetitM.  Shick M.D.   On: 06/22/2015 14:08   I have personally reviewed and evaluated these images and lab results as part of my medical decision-making.   EKG Interpretation None      MDM   Final diagnoses:  Pharyngitis  Cough   Patient presents with sore throat since Friday. VSS, NAD. Patient appears well, nontoxic or septic. On exam, no nuchal rigidity. Mild posterior oropharyngeal edema and erythema. Anterior cervical adenopathy. Heart RRR, lungs CTAP, abdomen soft and benign. Differential includes strep pharyngitis, URI, pneumonia. Low suspicion for epiglottitis, peritonsillar abscess, or meningitis. Will give Decadron, ibuprofen, and viscous lidocaine. Will obtain rapid strep and chest x-ray. Rapid strep negative. CXR negative. Plan to  discharge home with ibuprofen.  Discussed return precautions.  Patient agrees and acknowledges the above plan for discharge.     Cheri FowlerKayla Sevan Mcbroom, PA-C 06/22/15 1423  Bethann BerkshireJoseph Zammit, MD 06/22/15 [phone]

## 2015-06-24 LAB — CULTURE, GROUP A STREP (THRC)

## 2016-02-01 ENCOUNTER — Emergency Department (HOSPITAL_COMMUNITY)
Admission: EM | Admit: 2016-02-01 | Discharge: 2016-02-01 | Disposition: A | Discharge status: Home / Self Care | Payer: No Typology Code available for payment source | Attending: Emergency Medicine | Admitting: Emergency Medicine

## 2016-02-01 ENCOUNTER — Encounter (HOSPITAL_COMMUNITY): Payer: Self-pay | Admitting: Emergency Medicine

## 2016-02-01 ENCOUNTER — Emergency Department (HOSPITAL_COMMUNITY): Payer: No Typology Code available for payment source

## 2016-02-01 DIAGNOSIS — S301XXA Contusion of abdominal wall, initial encounter: Secondary | ICD-10-CM | POA: Insufficient documentation

## 2016-02-01 DIAGNOSIS — Y92410 Unspecified street and highway as the place of occurrence of the external cause: Secondary | ICD-10-CM | POA: Insufficient documentation

## 2016-02-01 DIAGNOSIS — Y999 Unspecified external cause status: Secondary | ICD-10-CM | POA: Insufficient documentation

## 2016-02-01 DIAGNOSIS — F17210 Nicotine dependence, cigarettes, uncomplicated: Secondary | ICD-10-CM | POA: Insufficient documentation

## 2016-02-01 DIAGNOSIS — S8012XA Contusion of left lower leg, initial encounter: Secondary | ICD-10-CM | POA: Insufficient documentation

## 2016-02-01 DIAGNOSIS — S3991XA Unspecified injury of abdomen, initial encounter: Secondary | ICD-10-CM | POA: Diagnosis present

## 2016-02-01 DIAGNOSIS — Y939 Activity, unspecified: Secondary | ICD-10-CM | POA: Diagnosis not present

## 2016-02-01 DIAGNOSIS — S8010XA Contusion of unspecified lower leg, initial encounter: Secondary | ICD-10-CM

## 2016-02-01 MED ORDER — CYCLOBENZAPRINE HCL 10 MG PO TABS: 10.0000 mg | ORAL_TABLET | Freq: Two times a day (BID) | ORAL | 0 refills | Status: AC | PRN

## 2016-02-01 MED ORDER — IBUPROFEN 600 MG PO TABS: 600.0000 mg | ORAL_TABLET | Freq: Four times a day (QID) | ORAL | 0 refills | Status: AC | PRN

## 2016-02-01 MED ORDER — IBUPROFEN 400 MG PO TABS
600.0000 mg | ORAL_TABLET | Freq: Once | ORAL | Status: AC
  Administered 2016-02-01: 600 mg via ORAL
  Filled 2016-02-01: qty 1
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
## 2016-02-01 NOTE — ED Notes (Signed)
Discharge instructions and prescriptions reviewed - voiced understanding 

## 2016-06-01 ENCOUNTER — Emergency Department (HOSPITAL_COMMUNITY)
Admission: EM | Admit: 2016-06-01 | Discharge: 2016-06-01 | Disposition: A | Discharge status: Home / Self Care | Payer: Medicaid Other | Attending: Emergency Medicine | Admitting: Emergency Medicine

## 2016-06-01 ENCOUNTER — Encounter (HOSPITAL_COMMUNITY): Payer: Self-pay

## 2016-06-01 DIAGNOSIS — J069 Acute upper respiratory infection, unspecified: Secondary | ICD-10-CM

## 2016-06-01 DIAGNOSIS — F17210 Nicotine dependence, cigarettes, uncomplicated: Secondary | ICD-10-CM | POA: Insufficient documentation

## 2016-06-01 LAB — URINALYSIS, ROUTINE W REFLEX MICROSCOPIC
Bilirubin Urine: NEGATIVE
GLUCOSE, UA: NEGATIVE mg/dL
Ketones, ur: 80 mg/dL — AB
LEUKOCYTES UA: NEGATIVE
Nitrite: NEGATIVE
Protein, ur: 30 mg/dL — AB
Specific Gravity, Urine: 1.027 (ref 1.005–1.030)
pH: 5 (ref 5.0–8.0)

## 2016-06-01 LAB — POC URINE PREG, ED: Preg Test, Ur: NEGATIVE

## 2016-06-01 LAB — RAPID STREP SCREEN (MED CTR MEBANE ONLY): Streptococcus, Group A Screen (Direct): NEGATIVE

## 2016-06-01 NOTE — ED Provider Notes (Signed)
[facility]EMERGENCY DEPT Provider Note   CSN: [id number] Arrival date & time: 06/01/16  1226  By signing my name below, I, Amber Bray, attest that this documentation has been prepared under the direction and in the presence of Rolan Bucco, MD. Electronically Signed: Christian Mate, Scribe. 06/01/2016. 2:35 PM.  History   Chief Complaint Chief Complaint  Patient presents with  . Sore Throat   The history is provided by the patient and medical records. No language interpreter was used.    HPI Comments:  Amber Bray is a 26 y.o. female with no pertinent PMHx, who presents to the Emergency Department complaining of a constant, gradually worsening, sore throat with 9/10 pain onset four days ago. She reports this sore throat began four days ago, gradually worsening since onset. She additionally reports some feelings of bilateral ear fullness and mild SOB during an episode at the store yesterday. She is tolerating liquids well and notes increased liquid PO intake since the onset of her symptoms. Pt has associated symptoms of congestion, rhinorrhea, productive cough with phlegm, urinary frequency, dysuria, dizziness, chills, and a HA. She tried Tylenol, Theraflu, and Aspirin at home with minimal relief of her symptoms. She states standing for Bray periods of time exacerbates her dizziness. Pt denies vaginal bleeding, vaginal discharge, abdominal pain, fever, and any other complaints at this time. Pt is a current every day smoker.   History reviewed. No pertinent past medical history.  There are no active problems to display for this patient.  History reviewed. No pertinent surgical history.  OB History    No data available     Home Medications    Prior to Admission medications   Medication Sig Start Date End Date Taking? Authorizing Provider  cyclobenzaprine (FLEXERIL) 10 MG tablet Take 1 tablet (10 mg total) by mouth 2 (two) times daily as needed for muscle spasms. 02/01/16   Elpidio Anis, PA-C  ibuprofen (ADVIL,MOTRIN) 600 MG tablet Take 1 tablet (600 mg total) by mouth every 6 (six) hours as needed. 02/01/16   Elpidio Anis, PA-C  levonorgestrel (MIRENA) 20 MCG/24HR IUD 1 each by Intrauterine route once.    Historical Provider, MD   Family History No family history on file.  Social History Social History  Substance Use Topics  . Smoking status: Current Every Day Smoker    Packs/day: 0.50    Types: Cigarettes  . Smokeless tobacco: Never Used  . Alcohol use Yes     Comment: socially    Allergies   Patient has no known allergies.   Review of Systems Review of Systems  Constitutional: Positive for chills. Negative for diaphoresis, fatigue and fever.  HENT: Positive for congestion, rhinorrhea and sore throat. Negative for sneezing.        Positive ear fullness  Eyes: Negative.   Respiratory: Positive for cough and shortness of breath (mild). Negative for chest tightness.   Cardiovascular: Negative for chest pain and leg swelling.  Gastrointestinal: Negative for abdominal pain, blood in stool, diarrhea, nausea and vomiting.  Genitourinary: Positive for dysuria and frequency. Negative for difficulty urinating, flank pain, hematuria, vaginal bleeding and vaginal discharge.  Musculoskeletal: Negative for arthralgias and back pain.  Skin: Negative for rash.  Neurological: Positive for dizziness and headaches. Negative for speech difficulty, weakness and numbness.     Physical Exam Updated Vital Signs BP (!) 122/98 (BP Location: Right Arm)   Pulse 100   Temp 99.1 F (37.3 C) (Oral)   Resp 16   LMP  04/27/2016 (Exact Date)   SpO2 95%   Physical Exam  Constitutional: She is oriented to person, place, and time. She appears well-developed and well-nourished.  HENT:  Head: Normocephalic and atraumatic.  Right Ear: Tympanic membrane normal.  Left Ear: Tympanic membrane normal.  Mouth/Throat: Uvula is midline.  Mild erythema to the posterior pharynx. No  exudates.   Eyes: Pupils are equal, round, and reactive to light.  Neck: Normal range of motion. Neck supple.  Cardiovascular: Normal rate, regular rhythm and normal heart sounds.   Pulmonary/Chest: Effort normal and breath sounds normal. No respiratory distress. She has no wheezes. She has no rales. She exhibits no tenderness.  Abdominal: Soft. Bowel sounds are normal. There is no tenderness. There is no rebound and no guarding.  Musculoskeletal: Normal range of motion. She exhibits no edema.  Lymphadenopathy:    She has no cervical adenopathy.  Neurological: She is alert and oriented to person, place, and time.  Skin: Skin is warm and dry. No rash noted.  Psychiatric: She has a normal mood and affect.     ED Treatments / Results  DIAGNOSTIC STUDIES:  Oxygen Saturation is 95% on RA, adequate by my interpretation.    COORDINATION OF CARE:  2:31 PM Discussed treatment plan with pt at bedside including rapid antigen test, UA, group A strep culture, and pt agreed to plan.  Labs (all labs ordered are listed, but only abnormal results are displayed) Labs Reviewed  URINALYSIS, ROUTINE W REFLEX MICROSCOPIC - Abnormal; Notable for the following:       Result Value   Color, Urine AMBER (*)    APPearance HAZY (*)    Hgb urine dipstick SMALL (*)    Ketones, ur 80 (*)    Protein, ur 30 (*)    Bacteria, UA RARE (*)    Squamous Epithelial / LPF 0-5 (*)    All other components within normal limits  RAPID STREP SCREEN (NOT AT [facility])  CULTURE, GROUP A STREP ([facility])  POC URINE PREG, ED    EKG  EKG Interpretation None       Radiology No results found.  Procedures Procedures (including critical care time)  Medications Ordered in ED Medications - No data to display   Initial Impression / Assessment and Plan / ED Course  I have reviewed the triage vital signs and the nursing notes.  Pertinent labs & imaging results that were available during my care of the patient were reviewed  by me and considered in my medical decision making (see chart for details).     Patient presents with URI symptoms. Her strep test is negative. Her pregnancy test is negative. Her urinalysis is not consistent with infection. She's otherwise well-appearing. There is no clinical signs of pneumonia. She was advised in symptomatic care. Return precautions were given.  Final Clinical Impressions(s) / ED Diagnoses   Final diagnoses:  Viral URI    New Prescriptions New Prescriptions   No medications on file    I personally performed the services described in this documentation, which was scribed in my presence.  The recorded information has been reviewed and considered.     Rolan Bucco, MD 06/01/16 1455

## 2016-06-01 NOTE — ED Triage Notes (Signed)
Pt presents with complaint of sore throat x 3-4 days. Pt reports hx of strep. States congestion and HA.

## 2016-06-03 LAB — CULTURE, GROUP A STREP (THRC)

## 2016-11-18 ENCOUNTER — Encounter (HOSPITAL_COMMUNITY): Payer: Self-pay

## 2016-11-18 ENCOUNTER — Emergency Department (HOSPITAL_COMMUNITY)
Admission: EM | Admit: 2016-11-18 | Discharge: 2016-11-18 | Disposition: A | Discharge status: Home / Self Care | Payer: No Typology Code available for payment source | Attending: Emergency Medicine | Admitting: Emergency Medicine

## 2016-11-18 DIAGNOSIS — Z87891 Personal history of nicotine dependence: Secondary | ICD-10-CM | POA: Insufficient documentation

## 2016-11-18 DIAGNOSIS — R112 Nausea with vomiting, unspecified: Secondary | ICD-10-CM | POA: Diagnosis not present

## 2016-11-18 DIAGNOSIS — R1110 Vomiting, unspecified: Secondary | ICD-10-CM

## 2016-11-18 DIAGNOSIS — R110 Nausea: Secondary | ICD-10-CM

## 2016-11-18 LAB — COMPREHENSIVE METABOLIC PANEL
ALT: 11 U/L — ABNORMAL LOW (ref 14–54)
AST: 15 U/L (ref 15–41)
Albumin: 3.8 g/dL (ref 3.5–5.0)
Alkaline Phosphatase: 79 U/L (ref 38–126)
Anion gap: 9 (ref 5–15)
BILIRUBIN TOTAL: 0.4 mg/dL (ref 0.3–1.2)
BUN: 14 mg/dL (ref 6–20)
CHLORIDE: 107 mmol/L (ref 101–111)
CO2: 20 mmol/L — AB (ref 22–32)
Calcium: 8.9 mg/dL (ref 8.9–10.3)
Creatinine, Ser: 0.86 mg/dL (ref 0.44–1.00)
Glucose, Bld: 89 mg/dL (ref 65–99)
POTASSIUM: 3.9 mmol/L (ref 3.5–5.1)
Sodium: 136 mmol/L (ref 135–145)
Total Protein: 7.4 g/dL (ref 6.5–8.1)

## 2016-11-18 LAB — CBC
HEMATOCRIT: 41.3 % (ref 36.0–46.0)
Hemoglobin: 13.6 g/dL (ref 12.0–15.0)
MCH: 28.6 pg (ref 26.0–34.0)
MCHC: 32.9 g/dL (ref 30.0–36.0)
MCV: 86.8 fL (ref 78.0–100.0)
Platelets: 294 10*3/uL (ref 150–400)
RBC: 4.76 MIL/uL (ref 3.87–5.11)
RDW: 13.3 % (ref 11.5–15.5)
WBC: 7.5 10*3/uL (ref 4.0–10.5)

## 2016-11-18 LAB — I-STAT BETA HCG BLOOD, ED (MC, WL, AP ONLY): I-stat hCG, quantitative: <5 m[IU]/mL (ref <5)

## 2016-11-18 LAB — LIPASE, BLOOD: Lipase: 19 U/L (ref 11–51)

## 2016-11-18 MED ORDER — ONDANSETRON HCL 4 MG PO TABS: 4.0000 mg | ORAL_TABLET | Freq: Three times a day (TID) | ORAL | 0 refills | Status: AC | PRN

## 2016-11-18 MED ORDER — ONDANSETRON 4 MG PO TBDP
ORAL_TABLET | ORAL | Status: AC
  Filled 2016-11-18: qty 1

## 2016-11-18 MED ORDER — ONDANSETRON 4 MG PO TBDP
4.0000 mg | ORAL_TABLET | Freq: Once | ORAL | Status: AC | PRN
  Administered 2016-11-18: 4 mg via ORAL

## 2016-11-18 NOTE — ED Notes (Signed)
PT states understanding of care given, follow up care, and medication prescribed. PT ambulated from ED to car with a steady gait. 

## 2016-11-18 NOTE — ED Provider Notes (Signed)
[facility] Provider Note   CSN: [id number] Arrival date & time: 11/18/16  1143     History   Chief Complaint Chief Complaint  Patient presents with  . Abdominal Pain    HPI Amber Bray is a 26 y.o. female with no significant past medical history who presents today with chief complaint acute onset, progressively improving nausea and vomiting. Patient works at [employer] and at around 8 AM at work she experienced an episode of nonbloody nonbilious emesis. Had another episode one hour later, so her manager recommended presenting to the ED for evaluation. Patient went home, took a Tylenol and vomited once more so she presented to the ED. She was given Zofran while in triage and states her symptoms entirely resolved. She says that she experiences similar symptoms in the days leading up to her menstrual cycle, and states that she is supposed to start her menstrual cycle today. Last mental. Was last month and was normal for her. She denies abdominal pain, fevers, chills, CP, SOB, urinary symptoms, vaginal symptoms, melena, hematochezia, diarrhea, or constipation. Last oral intake was yesterday at a [place] in which she has not eaten in the past. States that her husband ordered a different dish and is not experiencing symptoms. No other suspicious food intake, no recent treatment with antibiotics. After administration of Zofran, patient states that she drank an entire bottle of Pepsi and ate a bag of sun chips without difficulty, no nausea or emesis after this. She is requesting discharge at the time of my evaluation.  The history is provided by the patient.    History reviewed. No pertinent past medical history.  There are no active problems to display for this patient.   History reviewed. No pertinent surgical history.  OB History    No data available       Home Medications    Prior to Admission medications   Medication Sig Start Date End Date Taking? Authorizing  Provider  cyclobenzaprine (FLEXERIL) 10 MG tablet Take 1 tablet (10 mg total) by mouth 2 (two) times daily as needed for muscle spasms. 02/01/16   Elpidio Anis, PA-C  ibuprofen (ADVIL,MOTRIN) 600 MG tablet Take 1 tablet (600 mg total) by mouth every 6 (six) hours as needed. 02/01/16   Elpidio Anis, PA-C  levonorgestrel (MIRENA) 20 MCG/24HR IUD 1 each by Intrauterine route once.    [provider]  ondansetron (ZOFRAN) 4 MG tablet Take 1 tablet (4 mg total) by mouth every 8 (eight) hours as needed for nausea or vomiting. 11/18/16   Jeanie Sewer, PA-C    Family History No family history on file.  Social History Social History  Substance Use Topics  . Smoking status: Current Every Day Smoker    Packs/day: 0.50    Types: Cigarettes  . Smokeless tobacco: Never Used  . Alcohol use Yes     Comment: socially     Allergies   Patient has no known allergies.   Review of Systems Review of Systems  Constitutional: Negative for chills and fever.  Respiratory: Negative for shortness of breath.   Cardiovascular: Negative for chest pain.  Gastrointestinal: Positive for nausea and vomiting. Negative for abdominal pain, blood in stool, constipation and diarrhea.  Genitourinary: Negative for decreased urine volume, dysuria, flank pain, frequency, hematuria, menstrual problem, urgency, vaginal bleeding, vaginal discharge and vaginal pain.  All other systems reviewed and are negative.    Physical Exam Updated Vital Signs BP 120/78 (BP Location: Left Arm)   Pulse Marland Kitchen)  51   Temp 98.1 F (36.7 C) (Oral)   Resp 18   Ht 5' 6.5" (1.689 m)   Wt 115.2 kg (254 lb)   LMP 10/19/2016 (Exact Date)   SpO2 99%   BMI 40.38 kg/m   Physical Exam  Constitutional: She appears well-developed and well-nourished. No distress.  Resting comfortably, sitting up in bed, requesting discharge home on my arrival  HENT:  Head: Normocephalic and atraumatic.  Eyes: Conjunctivae are normal. Right eye  exhibits no discharge. Left eye exhibits no discharge.  Neck: Normal range of motion. Neck supple. No JVD present. No tracheal deviation present.  Cardiovascular: Normal rate, regular rhythm and normal heart sounds.   Pulmonary/Chest: Effort normal and breath sounds normal. No respiratory distress. She has no wheezes. She has no rales. She exhibits no tenderness.  Abdominal: Soft. Bowel sounds are normal. She exhibits no distension. There is no tenderness.  Murphy's sign absent, Rovsing sign absent, no tenderness at McBurney's point, no CVA tenderness.  Musculoskeletal: She exhibits no edema.  Neurological: She is alert.  Skin: Skin is warm and dry. No erythema.  Psychiatric: She has a normal mood and affect. Her behavior is normal.  Nursing note and vitals reviewed.    ED Treatments / Results  Labs (all labs ordered are listed, but only abnormal results are displayed) Labs Reviewed  COMPREHENSIVE METABOLIC PANEL - Abnormal; Notable for the following:       Result Value   CO2 20 (*)    ALT 11 (*)    All other components within normal limits  LIPASE, BLOOD  CBC  I-STAT BETA HCG BLOOD, ED ([facility], [facility], [facility] ONLY)    EKG  EKG Interpretation None       Radiology No results found.  Procedures Procedures (including critical care time)  Medications Ordered in ED Medications  ondansetron (ZOFRAN-ODT) 4 MG disintegrating tablet (not administered)  ondansetron (ZOFRAN-ODT) disintegrating tablet 4 mg (4 mg Oral Given 11/18/16 1153)     Initial Impression / Assessment and Plan / ED Course  I have reviewed the triage vital signs and the nursing notes.  Pertinent labs & imaging results that were available during my care of the patient were reviewed by me and considered in my medical decision making (see chart for details).    Patient with acute onset nausea and vomiting, resolved after being given Zofran. Afebrile, vital signs are stable. Labwork is reassuring. She was tolerating PO  food and fluids while in the waiting room after being given Zofran. States her symptoms have entirely resolved. Doubt colitis, obstruction, perforation, appendicitis, or other acute surgical abdominal pathology. Doubt PID, TOA, ovarian torsion, or other gynecological emergency. Doubt that this is an anginal equivalent as she is low risk for cardiac issues. She is requesting discharge home at this time, which I think is reasonable. Discussed strict ED return precautions. Will discharge with Zofran for nausea. Pt verbalized understanding of and agreement with plan and is safe for discharge home at this time.   Final diagnoses:  Nausea  Vomiting in adult patient    New Prescriptions Discharge Medication List as of 11/18/2016  3:36 PM    START taking these medications   Details  ondansetron (ZOFRAN) 4 MG tablet Take 1 tablet (4 mg total) by mouth every 8 (eight) hours as needed for nausea or vomiting., Starting Thu 11/18/2016, Print         Momina Hunton, [place] A, PA-C 11/18/16 1614    Pricilla Loveless, MD 11/19/16 2315

## 2016-11-18 NOTE — Discharge Instructions (Signed)
Take Zofran every 8 hours as needed for nausea. We around 20 minutes before you have something to eat after taking this medication. Eat a diet of bland foods that will not upset her stomach like saltine crackers, ginger ale, mashed potatoes, and plain chicken breast for the next few days. Return to the ED immediately if any concerning signs or symptoms develop such as abdominal pain, fevers, blood in your urine or stool or vomit.

## 2016-11-18 NOTE — ED Triage Notes (Signed)
Pt presents for evaluation of N/V starting this AM at work. Pt denies abd pain, reports feels completely normal until she vomits. Pt denies diarrhea. Reports due for menstrual cycle today.

## 2017-02-07 ENCOUNTER — Encounter (HOSPITAL_COMMUNITY): Payer: Self-pay | Admitting: Emergency Medicine

## 2017-02-07 ENCOUNTER — Emergency Department (HOSPITAL_COMMUNITY)
Admission: EM | Admit: 2017-02-07 | Discharge: 2017-02-07 | Disposition: A | Discharge status: Home / Self Care | Payer: Self-pay | Attending: Emergency Medicine | Admitting: Emergency Medicine

## 2017-02-07 ENCOUNTER — Other Ambulatory Visit: Payer: Self-pay

## 2017-02-07 DIAGNOSIS — Z79899 Other long term (current) drug therapy: Secondary | ICD-10-CM | POA: Insufficient documentation

## 2017-02-07 DIAGNOSIS — N3000 Acute cystitis without hematuria: Secondary | ICD-10-CM | POA: Insufficient documentation

## 2017-02-07 DIAGNOSIS — F17210 Nicotine dependence, cigarettes, uncomplicated: Secondary | ICD-10-CM | POA: Insufficient documentation

## 2017-02-07 DIAGNOSIS — R1084 Generalized abdominal pain: Secondary | ICD-10-CM

## 2017-02-07 DIAGNOSIS — R112 Nausea with vomiting, unspecified: Secondary | ICD-10-CM

## 2017-02-07 DIAGNOSIS — R197 Diarrhea, unspecified: Secondary | ICD-10-CM | POA: Insufficient documentation

## 2017-02-07 LAB — URINALYSIS, ROUTINE W REFLEX MICROSCOPIC
Bilirubin Urine: NEGATIVE
GLUCOSE, UA: NEGATIVE mg/dL
KETONES UR: NEGATIVE mg/dL
NITRITE: POSITIVE — AB
PH: 5 (ref 5.0–8.0)
Protein, ur: NEGATIVE mg/dL
Specific Gravity, Urine: 1.027 (ref 1.005–1.030)

## 2017-02-07 LAB — CBC
HCT: 43 % (ref 36.0–46.0)
Hemoglobin: 14.2 g/dL (ref 12.0–15.0)
MCH: 29.4 pg (ref 26.0–34.0)
MCHC: 33 g/dL (ref 30.0–36.0)
MCV: 89 fL (ref 78.0–100.0)
PLATELETS: 329 10*3/uL (ref 150–400)
RBC: 4.83 MIL/uL (ref 3.87–5.11)
RDW: 13.6 % (ref 11.5–15.5)
WBC: 10.6 10*3/uL — AB (ref 4.0–10.5)

## 2017-02-07 LAB — COMPREHENSIVE METABOLIC PANEL
ALK PHOS: 87 U/L (ref 38–126)
ALT: 12 U/L — AB (ref 14–54)
AST: 16 U/L (ref 15–41)
Albumin: 3.7 g/dL (ref 3.5–5.0)
Anion gap: 9 (ref 5–15)
BILIRUBIN TOTAL: 0.3 mg/dL (ref 0.3–1.2)
BUN: 11 mg/dL (ref 6–20)
CALCIUM: 8.8 mg/dL — AB (ref 8.9–10.3)
CO2: 22 mmol/L (ref 22–32)
CREATININE: 0.78 mg/dL (ref 0.44–1.00)
Chloride: 104 mmol/L (ref 101–111)
Glucose, Bld: 104 mg/dL — ABNORMAL HIGH (ref 65–99)
Potassium: 3.4 mmol/L — ABNORMAL LOW (ref 3.5–5.1)
Sodium: 135 mmol/L (ref 135–145)
TOTAL PROTEIN: 7.5 g/dL (ref 6.5–8.1)

## 2017-02-07 LAB — I-STAT BETA HCG BLOOD, ED (MC, WL, AP ONLY): I-stat hCG, quantitative: <5 m[IU]/mL (ref <5)

## 2017-02-07 LAB — LIPASE, BLOOD: Lipase: 24 U/L (ref 11–51)

## 2017-02-07 MED ORDER — ONDANSETRON HCL 4 MG/2ML IJ SOLN: 4.0000 mg | Freq: Once | INTRAMUSCULAR | Status: DC

## 2017-02-07 MED ORDER — CEPHALEXIN 500 MG PO CAPS: 500.0000 mg | ORAL_CAPSULE | Freq: Two times a day (BID) | ORAL | 0 refills | Status: AC

## 2017-02-07 MED ORDER — ONDANSETRON 4 MG PO TBDP: 4.0000 mg | ORAL_TABLET | Freq: Three times a day (TID) | ORAL | 0 refills | Status: DC | PRN

## 2017-02-07 MED ORDER — SODIUM CHLORIDE 0.9 % IV BOLUS (SEPSIS): 500.0000 mL | Freq: Once | INTRAVENOUS | Status: DC

## 2017-02-07 NOTE — ED Notes (Signed)
PA at bedside. Pt crying.

## 2017-02-07 NOTE — Discharge Instructions (Signed)
As discussed, make sure that you stay well-hydrated drinking enough fluids to keep your urine clear.  Take your entire course of antibiotics even if you feel better.  Try to get some rest. Zofran as needed for nausea vomiting. Follow-up with your primary care provider. Return sooner if symptoms worsen or new concerning symptoms in the meantime.

## 2017-02-07 NOTE — ED Provider Notes (Signed)
[facility]   CSN: [id number] Arrival date & time: 02/07/17  0345     History   Chief Complaint Chief Complaint  Patient presents with  . Abdominal Pain    HPI Amber Bray is a 26 y.o. female with no significant past medical history presenting with a few days of generalized abdominal discomfort, few episodes of emesis yesterday and diarrhea.  Patient reports known ill contacts with family members with same symptoms. She has also started to experience burning on urination and discomfort with wiping for the last couple days. She states that she has not had any vomiting or diarrhea since her arrival in the emergency department hours ago. Patient was drinking soda. She denies any nausea at this time. Denies any focal abdominal pain. No fever, chills or back pain.  HPI  History reviewed. No pertinent past medical history.  There are no active problems to display for this patient.   History reviewed. No pertinent surgical history.  OB History    No data available       Home Medications    Prior to Admission medications   Medication Sig Start Date End Date Taking? Authorizing Provider  cephALEXin (KEFLEX) 500 MG capsule Take 1 capsule (500 mg total) by mouth 2 (two) times daily for 7 days. 02/07/17 02/14/17  Mathews RobinsonsMitchell, Jessica B, PA-C  cyclobenzaprine (FLEXERIL) 10 MG tablet Take 1 tablet (10 mg total) by mouth 2 (two) times daily as needed for muscle spasms. 02/01/16   Elpidio AnisUpstill, Shari, PA-C  ibuprofen (ADVIL,MOTRIN) 600 MG tablet Take 1 tablet (600 mg total) by mouth every 6 (six) hours as needed. 02/01/16   Elpidio AnisUpstill, Shari, PA-C  levonorgestrel (MIRENA) 20 MCG/24HR IUD 1 each by Intrauterine route once.    [provider]  ondansetron (ZOFRAN ODT) 4 MG disintegrating tablet Take 1 tablet (4 mg total) by mouth every 8 (eight) hours as needed for nausea or vomiting. 02/07/17   Mathews RobinsonsMitchell, Jessica B, PA-C  ondansetron  (ZOFRAN) 4 MG tablet Take 1 tablet (4 mg total) by mouth every 8 (eight) hours as needed for nausea or vomiting. 11/18/16   Jeanie SewerFawze, Mina A, PA-C    Family History No family history on file.  Social History Social History   Tobacco Use  . Smoking status: Current Every Day Smoker    Packs/day: 0.50    Types: Cigarettes  . Smokeless tobacco: Never Used  Substance Use Topics  . Alcohol use: Yes    Comment: socially  . Drug use: No     Allergies   Ibuprofen   Review of Systems Review of Systems  Constitutional: Negative for chills and fever.  Respiratory: Negative for cough, shortness of breath and stridor.   Cardiovascular: Negative for chest pain and palpitations.  Gastrointestinal: Positive for abdominal pain, diarrhea, nausea and vomiting. Negative for abdominal distention and blood in stool.  Genitourinary: Positive for dysuria. Negative for difficulty urinating, flank pain, hematuria and pelvic pain.  Musculoskeletal: Negative for myalgias, neck pain and neck stiffness.  Skin: Negative for color change, pallor and rash.  Neurological: Negative for dizziness and light-headedness.     Physical Exam Updated Vital Signs BP 105/66 (BP Location: Right Arm)   Pulse 80   Temp 98.2 F (36.8 C) (Oral)   Resp 18   Ht 5\' 6"  (1.676 m)   Wt 115.2 kg (254 lb)   LMP 01/22/2017   SpO2 100%   BMI 41.00 kg/m   Physical Exam  Constitutional:  She appears well-developed and well-nourished.  Non-toxic appearance. She does not appear ill. No distress.  Afebrile, nontoxic-appearing, sitting comfortably in bed no acute distress.  HENT:  Head: Normocephalic and atraumatic.  Eyes: Conjunctivae and EOM are normal. No scleral icterus.  Neck: Neck supple.  Cardiovascular: Normal rate and regular rhythm.  No murmur heard. Pulmonary/Chest: Effort normal and breath sounds normal. No stridor. No respiratory distress. She has no wheezes. She has no rhonchi. She has no rales.  Abdominal:  Soft. Normal appearance. She exhibits no distension. There is no tenderness. There is no rigidity, no rebound, no guarding and no CVA tenderness.  Abdomen is soft and nontender to palpation.  No CVA tenderness  Musculoskeletal: She exhibits no edema.  Neurological: She is alert.  Skin: Skin is warm and dry. No rash noted. She is not diaphoretic. No cyanosis or erythema. No pallor.  Psychiatric: She has a normal mood and affect.  Nursing note and vitals reviewed.    ED Treatments / Results  Labs (all labs ordered are listed, but only abnormal results are displayed) Labs Reviewed  COMPREHENSIVE METABOLIC PANEL - Abnormal; Notable for the following components:      Result Value   Potassium 3.4 (*)    Glucose, Bld 104 (*)    Calcium 8.8 (*)    ALT 12 (*)    All other components within normal limits  CBC - Abnormal; Notable for the following components:   WBC 10.6 (*)    All other components within normal limits  URINALYSIS, ROUTINE W REFLEX MICROSCOPIC - Abnormal; Notable for the following components:   APPearance HAZY (*)    Hgb urine dipstick SMALL (*)    Nitrite POSITIVE (*)    Leukocytes, UA LARGE (*)    Bacteria, UA MANY (*)    Squamous Epithelial / LPF 6-30 (*)    All other components within normal limits  URINE CULTURE  LIPASE, BLOOD  I-STAT BETA HCG BLOOD, ED ([facility], [facility], [facility] ONLY)    EKG  EKG Interpretation None       Radiology No results found.  Procedures Procedures (including critical care time)  Medications Ordered in ED Medications - No data to display   Initial Impression / Assessment and Plan / ED Course  I have reviewed the triage vital signs and the nursing notes.  Pertinent labs & imaging results that were available during my care of the patient were reviewed by me and considered in my medical decision making (see chart for details).    Patient presenting with a few days of nausea/vomiting/diarrhea with known ill contacts with same symptoms.  Her  symptoms resolved since last night.  Patient has not had any vomiting diarrhea or nausea since in the emergency department.  Her main concern is dysuria. UA with clear signs of UTI, ordered culture.  Reassuring exam, abdomen is soft and nontender to palpation, afebrile, nontoxic, normal vital signs and stable.  Discharge home with antibiotics, symptomatic relief and close follow-up with PCP as needed.  Discussed strict return precautions and advised to return to the emergency department if experiencing any new or worsening symptoms. Instructions were understood and patient agreed with discharge plan.  Final Clinical Impressions(s) / ED Diagnoses   Final diagnoses:  Acute cystitis without hematuria  Nausea vomiting and diarrhea  Generalized abdominal pain    ED Discharge Orders        Ordered    cephALEXin (KEFLEX) 500 MG capsule  2 times daily  02/07/17 0937    ondansetron (ZOFRAN ODT) 4 MG disintegrating tablet  Every 8 hours PRN     02/07/17 0937       Georgiana ShoreMitchell, Jessica B, PA-C 02/07/17 [id number]    Little, Ambrose Finlandachel Morgan, MD 02/08/17 1034

## 2017-02-07 NOTE — ED Triage Notes (Signed)
Pt c/o generalized abd pain x 3-4 days, 3-4 episodes of emesis in last 24 hours, diarrhea "all day". Pt also c/o burning with urination and itching when wiping after urination. Pt states she was recently around family that had been ill with stomach virus.

## 2017-02-07 NOTE — ED Notes (Signed)
Pt was called for a room in the lobby with no answer. Pt was sleeping and is now placed in hallway bed due to next patient taking her assigned room when she would not answer. Pt very upset about being placed in the hallway and offered to take her back out to lobby to wait for next room. Pt still upset but agrees to be seen in hallway.

## 2017-02-07 NOTE — ED Notes (Signed)
Pt called for x3 by nurse. No answer from pt.

## 2017-02-09 LAB — URINE CULTURE: Culture: >=100000 — AB

## 2017-02-10 ENCOUNTER — Telehealth: Payer: Self-pay | Admitting: *Deleted

## 2017-02-10 NOTE — Telephone Encounter (Signed)
Post ED Visit - Positive Culture Follow-up  Culture report reviewed by antimicrobial stewardship pharmacist:  []  Enzo BiNathan Batchelder, Pharm.D. []  Celedonio MiyamotoJeremy Frens, Pharm.D., BCPS AQ-ID []  Garvin FilaMike Maccia, Pharm.D., BCPS []  Georgina PillionElizabeth Martin, Pharm.D., BCPS []  Jerry CityMinh Pham, [address] Rainbow BoulevardPharm.D., BCPS, AAHIVP []  Estella HuskMichelle Turner, Pharm.D., BCPS, AAHIVP []  Lysle Pearlachel Rumbarger, PharmD, BCPS []  Blake DivineShannon Parkey, PharmD []  Pollyann SamplesAndy Johnston, PharmD, BCPS Dimple NanasShannon Parker, PharmD  Positive urine culture Treated with Cephalexin, organism sensitive to the same and no further patient follow-up is required at this time.  Virl AxeRobertson, Lamira Borin [facility] 02/10/2017, 11:06 AM

## 2017-04-04 ENCOUNTER — Emergency Department (HOSPITAL_COMMUNITY)
Admission: EM | Admit: 2017-04-04 | Discharge: 2017-04-04 | Disposition: A | Discharge status: Home / Self Care | Payer: Self-pay | Attending: Emergency Medicine | Admitting: Emergency Medicine

## 2017-04-04 ENCOUNTER — Other Ambulatory Visit: Payer: Self-pay

## 2017-04-04 ENCOUNTER — Encounter (HOSPITAL_COMMUNITY): Payer: Self-pay | Admitting: *Deleted

## 2017-04-04 DIAGNOSIS — R112 Nausea with vomiting, unspecified: Secondary | ICD-10-CM | POA: Insufficient documentation

## 2017-04-04 DIAGNOSIS — R509 Fever, unspecified: Secondary | ICD-10-CM | POA: Insufficient documentation

## 2017-04-04 DIAGNOSIS — F17210 Nicotine dependence, cigarettes, uncomplicated: Secondary | ICD-10-CM | POA: Insufficient documentation

## 2017-04-04 LAB — LIPASE, BLOOD: LIPASE: 19 U/L (ref 11–51)

## 2017-04-04 LAB — URINALYSIS, ROUTINE W REFLEX MICROSCOPIC
Bilirubin Urine: NEGATIVE
GLUCOSE, UA: NEGATIVE mg/dL
HGB URINE DIPSTICK: NEGATIVE
Ketones, ur: NEGATIVE mg/dL
Leukocytes, UA: NEGATIVE
Nitrite: NEGATIVE
Protein, ur: NEGATIVE mg/dL
SPECIFIC GRAVITY, URINE: 1.027 (ref 1.005–1.030)
pH: 5 (ref 5.0–8.0)

## 2017-04-04 LAB — CBC
HCT: 42.6 % (ref 36.0–46.0)
HEMOGLOBIN: 13.8 g/dL (ref 12.0–15.0)
MCH: 28.6 pg (ref 26.0–34.0)
MCHC: 32.4 g/dL (ref 30.0–36.0)
MCV: 88.2 fL (ref 78.0–100.0)
Platelets: 303 10*3/uL (ref 150–400)
RBC: 4.83 MIL/uL (ref 3.87–5.11)
RDW: 13.5 % (ref 11.5–15.5)
WBC: 5.8 10*3/uL (ref 4.0–10.5)

## 2017-04-04 LAB — COMPREHENSIVE METABOLIC PANEL
ALBUMIN: 3.5 g/dL (ref 3.5–5.0)
ALT: 13 U/L — AB (ref 14–54)
AST: 22 U/L (ref 15–41)
Alkaline Phosphatase: 72 U/L (ref 38–126)
Anion gap: 10 (ref 5–15)
BUN: 9 mg/dL (ref 6–20)
CHLORIDE: 107 mmol/L (ref 101–111)
CO2: 21 mmol/L — AB (ref 22–32)
CREATININE: 0.76 mg/dL (ref 0.44–1.00)
Calcium: 8.9 mg/dL (ref 8.9–10.3)
GFR calc non Af Amer: >60 mL/min (ref >60)
Glucose, Bld: 90 mg/dL (ref 65–99)
Potassium: 3.5 mmol/L (ref 3.5–5.1)
SODIUM: 138 mmol/L (ref 135–145)
Total Bilirubin: 0.6 mg/dL (ref 0.3–1.2)
Total Protein: 6.8 g/dL (ref 6.5–8.1)

## 2017-04-04 LAB — I-STAT BETA HCG BLOOD, ED (MC, WL, AP ONLY): I-stat hCG, quantitative: <5 m[IU]/mL (ref <5)

## 2017-04-04 MED ORDER — ONDANSETRON 4 MG PO TBDP: 4.0000 mg | ORAL_TABLET | Freq: Three times a day (TID) | ORAL | 0 refills | Status: AC | PRN

## 2017-04-04 NOTE — ED Provider Notes (Signed)
[facility] EMERGENCY DEPARTMENT Provider Note   CSN: [id number] Arrival date & time: 04/04/17  1350     History   Chief Complaint Chief Complaint  Patient presents with  . Fever  . Emesis    HPI Amber Bray is a 27 y.o. female.  Patient presents to the ED with a chief complaint of nausea and vomiting and subjective fever.  She states that she got sick 2 days ago.  She reports that her niece was sick with the same.  She denies any abdominal pain or chest pain.  No reported dysuria or vaginal discharge.  Denies any diarrhea.  She states that her symptoms have started to improve over the past several hours.   The history is provided by the patient. No language interpreter was used.    History reviewed. No pertinent past medical history.  There are no active problems to display for this patient.   History reviewed. No pertinent surgical history.  OB History    No data available       Home Medications    Prior to Admission medications   Medication Sig Start Date End Date Taking? Authorizing Provider  cyclobenzaprine (FLEXERIL) 10 MG tablet Take 1 tablet (10 mg total) by mouth 2 (two) times daily as needed for muscle spasms. 02/01/16   Elpidio Anis, PA-C  ibuprofen (ADVIL,MOTRIN) 600 MG tablet Take 1 tablet (600 mg total) by mouth every 6 (six) hours as needed. 02/01/16   Elpidio Anis, PA-C  levonorgestrel (MIRENA) 20 MCG/24HR IUD 1 each by Intrauterine route once.    [provider]  ondansetron (ZOFRAN ODT) 4 MG disintegrating tablet Take 1 tablet (4 mg total) by mouth every 8 (eight) hours as needed for nausea or vomiting. 04/04/17   Roxy Horseman, PA-C  ondansetron (ZOFRAN) 4 MG tablet Take 1 tablet (4 mg total) by mouth every 8 (eight) hours as needed for nausea or vomiting. 11/18/16   Jeanie Sewer, PA-C    Family History History reviewed. No pertinent family history.  Social History Social History   Tobacco Use  . Smoking  status: Current Every Day Smoker    Packs/day: 0.50    Types: Cigarettes  . Smokeless tobacco: Never Used  Substance Use Topics  . Alcohol use: Yes    Comment: socially  . Drug use: No     Allergies   Ibuprofen   Review of Systems Review of Systems  All other systems reviewed and are negative.    Physical Exam Updated Vital Signs BP (!) 113/58   Pulse 77   Temp 98 F (36.7 C) (Oral)   Resp 16   LMP 03/22/2017   SpO2 100%   Physical Exam  Constitutional: She is oriented to person, place, and time. She appears well-developed and well-nourished.  Very well appearing  HENT:  Head: Normocephalic and atraumatic.  Eyes: Conjunctivae and EOM are normal. Pupils are equal, round, and reactive to light.  Neck: Normal range of motion. Neck supple.  Cardiovascular: Normal rate and regular rhythm. Exam reveals no gallop and no friction rub.  No murmur heard. Pulmonary/Chest: Effort normal and breath sounds normal. No respiratory distress. She has no wheezes. She has no rales. She exhibits no tenderness.  Abdominal: Soft. Bowel sounds are normal. She exhibits no distension and no mass. There is no tenderness. There is no rebound and no guarding.  No focal abdominal tenderness, no RLQ tenderness or pain at McBurney's point, no RUQ tenderness or Murphy's sign, no  left-sided abdominal tenderness, no fluid wave, or signs of peritonitis   Musculoskeletal: Normal range of motion. She exhibits no edema or tenderness.  Neurological: She is alert and oriented to person, place, and time.  Skin: Skin is warm and dry.  Psychiatric: She has a normal mood and affect. Her behavior is normal. Judgment and thought content normal.  Nursing note and vitals reviewed.    ED Treatments / Results  Labs (all labs ordered are listed, but only abnormal results are displayed) Labs Reviewed  COMPREHENSIVE METABOLIC PANEL - Abnormal; Notable for the following components:      Result Value   CO2 21 (*)     ALT 13 (*)    All other components within normal limits  URINALYSIS, ROUTINE W REFLEX MICROSCOPIC - Abnormal; Notable for the following components:   APPearance HAZY (*)    All other components within normal limits  LIPASE, BLOOD  CBC  I-STAT BETA HCG BLOOD, ED ([facility], [facility], [facility] ONLY)    EKG  EKG Interpretation None       Radiology No results found.  Procedures Procedures (including critical care time)  Medications Ordered in ED Medications - No data to display   Initial Impression / Assessment and Plan / ED Course  I have reviewed the triage vital signs and the nursing notes.  Pertinent labs & imaging results that were available during my care of the patient were reviewed by me and considered in my medical decision making (see chart for details).     Patient with reported n/v and subjective fever.  Family member sick with the same.  VSS.  Not pregnant.  Unremarkable LFTs and lipase.  No leukocytosis.  Abdomen is soft and non-tender.  Doubt surgical or acute abdomen.  DC to home with zofran.  Return precautions given.  Final Clinical Impressions(s) / ED Diagnoses   Final diagnoses:  Nausea and vomiting, intractability of vomiting not specified, unspecified vomiting type    ED Discharge Orders        Ordered    ondansetron (ZOFRAN ODT) 4 MG disintegrating tablet  Every 8 hours PRN     04/04/17 2244       Roxy HorsemanBrowning, Aara Jacquot, PA-C 04/04/17 2248    Gwyneth SproutPlunkett, Whitney, MD 04/05/17 2023

## 2017-04-04 NOTE — ED Triage Notes (Signed)
Pt reports onset this am of n/v and fever. Denies diarrhea, headache or bodyaches.

## 2017-04-04 NOTE — ED Notes (Signed)
Patient has not had any vomiting episodes while in waiting room.  Afebrile.
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
## 2022-01-08 DIAGNOSIS — Z419 Encounter for procedure for purposes other than remedying health state, unspecified: Secondary | ICD-10-CM | POA: Diagnosis not present

## 2022-02-08 DIAGNOSIS — Z419 Encounter for procedure for purposes other than remedying health state, unspecified: Secondary | ICD-10-CM | POA: Diagnosis not present

## 2022-03-11 DIAGNOSIS — Z419 Encounter for procedure for purposes other than remedying health state, unspecified: Secondary | ICD-10-CM | POA: Diagnosis not present

## 2022-04-09 DIAGNOSIS — Z419 Encounter for procedure for purposes other than remedying health state, unspecified: Secondary | ICD-10-CM | POA: Diagnosis not present

## 2022-05-10 DIAGNOSIS — Z419 Encounter for procedure for purposes other than remedying health state, unspecified: Secondary | ICD-10-CM | POA: Diagnosis not present

## 2022-06-09 DIAGNOSIS — Z419 Encounter for procedure for purposes other than remedying health state, unspecified: Secondary | ICD-10-CM | POA: Diagnosis not present

## 2022-07-10 DIAGNOSIS — Z419 Encounter for procedure for purposes other than remedying health state, unspecified: Secondary | ICD-10-CM | POA: Diagnosis not present

## 2022-08-09 DIAGNOSIS — Z419 Encounter for procedure for purposes other than remedying health state, unspecified: Secondary | ICD-10-CM | POA: Diagnosis not present

## 2022-09-09 DIAGNOSIS — Z419 Encounter for procedure for purposes other than remedying health state, unspecified: Secondary | ICD-10-CM | POA: Diagnosis not present

## 2022-10-10 DIAGNOSIS — Z419 Encounter for procedure for purposes other than remedying health state, unspecified: Secondary | ICD-10-CM | POA: Diagnosis not present

## 2022-11-09 DIAGNOSIS — Z419 Encounter for procedure for purposes other than remedying health state, unspecified: Secondary | ICD-10-CM | POA: Diagnosis not present

## 2022-12-10 DIAGNOSIS — Z419 Encounter for procedure for purposes other than remedying health state, unspecified: Secondary | ICD-10-CM | POA: Diagnosis not present

## 2023-01-09 DIAGNOSIS — Z419 Encounter for procedure for purposes other than remedying health state, unspecified: Secondary | ICD-10-CM | POA: Diagnosis not present

## 2023-02-09 DIAGNOSIS — Z419 Encounter for procedure for purposes other than remedying health state, unspecified: Secondary | ICD-10-CM | POA: Diagnosis not present
